# Patient Record
Sex: Male | Born: 1955
Health system: Southern US, Community
[De-identification: ages and names within clinical notes are randomized; demographics above are authoritative.]

## PROBLEM LIST (undated history)

## (undated) DIAGNOSIS — I10 Essential (primary) hypertension: Secondary | ICD-10-CM

## (undated) DIAGNOSIS — W3400XA Accidental discharge from unspecified firearms or gun, initial encounter: Secondary | ICD-10-CM

## (undated) DIAGNOSIS — S065X9A Traumatic subdural hemorrhage with loss of consciousness of unspecified duration, initial encounter: Secondary | ICD-10-CM

## (undated) DIAGNOSIS — F1010 Alcohol abuse, uncomplicated: Secondary | ICD-10-CM

## (undated) DIAGNOSIS — I609 Nontraumatic subarachnoid hemorrhage, unspecified: Secondary | ICD-10-CM

## (undated) DIAGNOSIS — S065XAA Traumatic subdural hemorrhage with loss of consciousness status unknown, initial encounter: Secondary | ICD-10-CM

## (undated) HISTORY — PX: HERNIA REPAIR: SHX51

## (undated) HISTORY — PX: MANDIBLE SURGERY: SHX707

---

## 2001-11-16 ENCOUNTER — Emergency Department (HOSPITAL_COMMUNITY): Admission: EM | Admit: 2001-11-16 | Discharge: 2001-11-16 | Payer: Self-pay | Admitting: Emergency Medicine

## 2006-04-15 ENCOUNTER — Encounter: Payer: Self-pay | Admitting: Surgery

## 2006-04-15 ENCOUNTER — Inpatient Hospital Stay (HOSPITAL_COMMUNITY): Admission: EM | Admit: 2006-04-15 | Discharge: 2006-04-20 | Payer: Self-pay | Admitting: Emergency Medicine

## 2007-03-08 ENCOUNTER — Emergency Department (HOSPITAL_COMMUNITY): Admission: EM | Admit: 2007-03-08 | Discharge: 2007-03-08 | Payer: Self-pay | Admitting: Emergency Medicine

## 2007-03-16 ENCOUNTER — Emergency Department (HOSPITAL_COMMUNITY): Admission: EM | Admit: 2007-03-16 | Discharge: 2007-03-16 | Payer: Self-pay | Admitting: Emergency Medicine
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
## 2011-12-28 ENCOUNTER — Encounter (HOSPITAL_COMMUNITY): Payer: Self-pay | Admitting: Emergency Medicine

## 2011-12-28 ENCOUNTER — Observation Stay (HOSPITAL_COMMUNITY)
Admission: EM | Admit: 2011-12-28 | Discharge: 2011-12-29 | Disposition: A | Discharge status: Home / Self Care | Payer: Self-pay | Attending: Emergency Medicine | Admitting: Emergency Medicine

## 2011-12-28 ENCOUNTER — Emergency Department (HOSPITAL_COMMUNITY): Payer: Self-pay

## 2011-12-28 DIAGNOSIS — I440 Atrioventricular block, first degree: Secondary | ICD-10-CM | POA: Insufficient documentation

## 2011-12-28 DIAGNOSIS — I441 Atrioventricular block, second degree: Secondary | ICD-10-CM | POA: Insufficient documentation

## 2011-12-28 DIAGNOSIS — R0789 Other chest pain: Principal | ICD-10-CM | POA: Insufficient documentation

## 2011-12-28 DIAGNOSIS — R1013 Epigastric pain: Secondary | ICD-10-CM | POA: Insufficient documentation

## 2011-12-28 LAB — COMPREHENSIVE METABOLIC PANEL
Albumin: 3.9 g/dL (ref 3.5–5.2)
Alkaline Phosphatase: 88 U/L (ref 39–117)
BUN: 13 mg/dL (ref 6–23)
CO2: 27 mEq/L (ref 19–32)
Chloride: 99 mEq/L (ref 96–112)
Creatinine, Ser: 0.81 mg/dL (ref 0.50–1.35)
GFR calc non Af Amer: >90 mL/min (ref >90)
Potassium: 3.8 mEq/L (ref 3.5–5.1)
Total Bilirubin: 0.6 mg/dL (ref 0.3–1.2)

## 2011-12-28 LAB — CBC
HCT: 45 % (ref 39.0–52.0)
Hemoglobin: 15.4 g/dL (ref 13.0–17.0)
RBC: 6.33 MIL/uL — ABNORMAL HIGH (ref 4.22–5.81)
WBC: 6.4 10*3/uL (ref 4.0–10.5)

## 2011-12-28 LAB — DIFFERENTIAL
Basophils Relative: 0 % (ref 0–1)
Lymphocytes Relative: 43 % (ref 12–46)
Lymphs Abs: 2.7 10*3/uL (ref 0.7–4.0)
Monocytes Absolute: 0.4 10*3/uL (ref 0.1–1.0)
Monocytes Relative: 7 % (ref 3–12)
Neutro Abs: 3.1 10*3/uL (ref 1.7–7.7)
Neutrophils Relative %: 48 % (ref 43–77)

## 2011-12-28 LAB — LIPASE, BLOOD: Lipase: 23 U/L (ref 11–59)

## 2011-12-28 LAB — POCT I-STAT TROPONIN I
Troponin i, poc: 0 ng/mL (ref 0.00–0.08)
Troponin i, poc: 0 ng/mL (ref 0.00–0.08)

## 2011-12-28 LAB — TROPONIN I: Troponin I: <0.3 ng/mL (ref <0.30)

## 2011-12-28 MED ORDER — MORPHINE SULFATE 4 MG/ML IJ SOLN
4.0000 mg | Freq: Once | INTRAMUSCULAR | Status: AC
  Administered 2011-12-28: 4 mg via INTRAVENOUS
  Filled 2011-12-28: qty 1

## 2011-12-28 MED ORDER — METOPROLOL TARTRATE 25 MG PO TABS
100.0000 mg | ORAL_TABLET | Freq: Once | ORAL | Status: AC
  Administered 2011-12-28: 100 mg via ORAL
  Filled 2011-12-28: qty 4

## 2011-12-28 MED ORDER — METOPROLOL TARTRATE 25 MG PO TABS
100.0000 mg | ORAL_TABLET | Freq: Once | ORAL | Status: DC
  Filled 2011-12-28: qty 4

## 2011-12-28 MED ORDER — GI COCKTAIL ~~LOC~~
30.0000 mL | Freq: Once | ORAL | Status: AC
  Administered 2011-12-28: 30 mL via ORAL
  Filled 2011-12-28: qty 30

## 2011-12-28 MED ORDER — METOPROLOL TARTRATE 1 MG/ML IV SOLN: 5.0000 mg | Freq: Once | INTRAVENOUS | Status: DC

## 2011-12-28 MED ORDER — ONDANSETRON HCL 4 MG/2ML IJ SOLN
4.0000 mg | Freq: Four times a day (QID) | INTRAMUSCULAR | Status: DC | PRN
  Filled 2011-12-28: qty 2

## 2011-12-28 MED ORDER — METOPROLOL TARTRATE 1 MG/ML IV SOLN
5.0000 mg | Freq: Once | INTRAVENOUS | Status: AC
  Administered 2011-12-28: 5 mg via INTRAVENOUS
  Filled 2011-12-28: qty 5

## 2011-12-28 MED ORDER — METOPROLOL TARTRATE 25 MG PO TABS
50.0000 mg | ORAL_TABLET | Freq: Once | ORAL | Status: AC
  Administered 2011-12-28: 50 mg via ORAL
  Filled 2011-12-28: qty 2

## 2011-12-28 NOTE — ED Notes (Signed)
Pt states he had this same pain 2 weeks ago , but it went away has no past hx

## 2011-12-28 NOTE — ED Provider Notes (Signed)
Patient in CDU under chest pain protocol.  Patient was scheduled for coronary CT today, but his heart rate persistently remained above the recommended target rate despite several doses of metoprolol.  Plan was amended to have patient remain in CDU on protocol overnight, and again attempt to complete the study in the AM.  Patient has rested comfortably in bed while in CDU. Troponins negative X 3.  Lungs CTA bilaterally. S1/S2, RRR, no murmur.  Abdomen soft, bowel sounds present.  Strong distal pulses palpated all extremities.   No return of presenting symptoms.  However, patient is now experiencing episodes of heart rate in the mid 30's, but is otherwise asymptomatic (denies CP, dyspnea, diaphoresis, nausea) and is maintaining blood pressure.  Repeat ECG negative for ischemia.  Rhythm strip reveals first degree block with interposed episodes of Mobitz I and Mobitz II rhythm.     Date: 12/28/2011 @ 2010  Rate: 80  Rhythm: sinus bradycardia  QRS Axis: normal  Intervals: PR prolonged and QT prolonged  ST/T Wave abnormalities: nonspecific ST changes  Conduction Disutrbances:second-degree A-V block, ( Mobitz II )  Narrative Interpretation:  First degree block with episodes of Mobitz I and II  Old EKG Reviewed: changes noted   2040:  Spoke with on-call cardiology Tresa Endo) regarding patient.  He feels current rhythm disturbance is likely a side effect of the previously administered metoprolol (patient had received a total of 150 mg po between 1200 and 1415, with a 5 mg dose IV at 1330).  Dr. Tresa Endo recommends having patient continue on protocol, and closely monitor his cardiac status overnight.  As long as rhythm disturbance normalizes, patient can receive a dose of metoprolol in AM for rate control if needed.  May need to consider dopamine if rate stays persistently slow.  12:09 AM Patient remains in a first degree block, still having episodes of second degree block, but frequency has decreased.  Patient  remains asymptomatic.  12:28 AM Patient report provided to Dr. Dierdre Highman.  Plan to withhold beta blocker in AM--decision on CT vs stress echo pending reassessment in AM.  Jimmye Norman, NP 12/29/11 0030

## 2011-12-28 NOTE — ED Notes (Signed)
Pt denies any needs at this time.  Pt in NAD, respirations even and unlabored.

## 2011-12-28 NOTE — ED Notes (Signed)
Turned lights down for pt comfort.  Pt denies any needs at this time.

## 2011-12-28 NOTE — ED Notes (Signed)
Called CT for clarification of test.  Per PA Onalee Hua and radiologist, pt will be held overnight and ?CT/stress in morning.  Meal tray ordered.

## 2011-12-28 NOTE — ED Notes (Signed)
Pt having 2-3/10 CP, pt states it feels like indigestion.  NP Katrinka Blazing aware.  EKG x2 given to NP.

## 2011-12-28 NOTE — ED Notes (Signed)
Pt resting in stretcher.  Pt continues to have HR ranging from low 40's to mid 80's.  Pt denies any needs at this time.  Lights dimmed for pt comfort.

## 2011-12-28 NOTE — ED Notes (Signed)
Patient resting comfortably on stretcher watching TV states chest pain intermittent over a week currently chest pain epigastric 3/10 burning. Airway intact bilateral equal chest rise and fall.

## 2011-12-28 NOTE — ED Provider Notes (Signed)
Pt report received from Lorenz Coaster, [place].  56 year-old male presents with intermittent cp x 1 week.  Pain suggestive of GERD.  No known cardiac hx, however, pt does have risk factors including smoking.  No prior provacative testing.  Pt will be place in CDU under chest pain protocol with coronary CT.  3:13 PM Report given to Felicie Morn, NP who will continue to monitor pt and will dispo as appropriate.    Fayrene Helper, PA-C 12/28/11 1514

## 2011-12-28 NOTE — ED Notes (Signed)
Changes noted on monitor.  NP Katrinka Blazing notified.

## 2011-12-28 NOTE — ED Notes (Signed)
CT called to update on PT HR  And B/P . Pt also reports a CP 4/10

## 2011-12-28 NOTE — ED Notes (Signed)
Meal tray to bedside.

## 2011-12-28 NOTE — ED Notes (Signed)
Tech getting EKG at this time.

## 2011-12-28 NOTE — ED Notes (Signed)
Pt resting.  No needs at this time.  VS remain stable at this time.

## 2011-12-28 NOTE — Progress Notes (Signed)
Observation review is complete. 

## 2011-12-28 NOTE — ED Notes (Signed)
PA spoke with radiologist and the EDP.  Going to give a second dose of metoprolol po instead going to administer IV.

## 2011-12-28 NOTE — ED Provider Notes (Signed)
History     CSN: [id number]  Arrival date & time 12/28/11  0801   First MD Initiated Contact with Patient 12/28/11 0809      Chief Complaint  Patient presents with  . Chest Pain    (Consider location/radiation/quality/duration/timing/severity/associated sxs/prior treatment) The history is provided by the patient.  Pt seen initially at 8:15 AM. 56 y/o generally healthy male who presents to the emergency department with a chief complaint of lower substernal and epigastric pain that began around 645 this morning while he was going about his typical morning activities. Pain described as burning initially and became sharper as it progressed. Nonradiating. Associated with mild nausea, no shortness of breath or diaphoresis. No recent illness include fever or cough. He took 325 mg of aspirin prior to EMS arrival per dispatcher. He was given nitroglycerin by EMS and reports that his pain did improve after that. Currently reports pain is only mild. His risk factors include smoking. He denies any knowledge of hypertension, hyperlipidemia, diabetes, cocaine use. No known cardiac history. Has never seen a cardiologist and has never had a stress test. Denies any known family history of coronary disease. Denies any personal or known family history of pulmonary embolus or DVT.  History reviewed. No pertinent past medical history.  No past surgical history on file.  No family history on file.  History  Substance Use Topics  . Smoking status: Current Some Day Smoker  . Smokeless tobacco: Not on file  . Alcohol Use:       Review of Systems  Constitutional: Negative for fever and diaphoresis.  Respiratory: Negative for cough and shortness of breath.   Cardiovascular: Positive for chest pain. Negative for leg swelling.  Gastrointestinal: Positive for nausea. Negative for vomiting and abdominal pain.  Musculoskeletal: Negative for back pain.  Neurological: Negative for dizziness, syncope and  weakness.  All other systems reviewed and are negative.    Allergies  Review of patient's allergies indicates no known allergies.  Home Medications  No current outpatient prescriptions on file.  BP 144/92  Pulse 97  Temp(Src) 98.2 F (36.8 C) (Oral)  Resp 18  SpO2 98%  Physical Exam  Nursing note and vitals reviewed. Constitutional: He appears well-developed and well-nourished. No distress.  HENT:  Head: Normocephalic and atraumatic.  Right Ear: External ear normal.  Left Ear: External ear normal.       Oral mucosa moist.  Eyes: Pupils are equal, round, and reactive to light.  Neck: Neck supple.  Cardiovascular: Normal rate, regular rhythm and normal heart sounds.  Exam reveals no friction rub.   No murmur heard. Pulses:      Radial pulses are 2+ on the right side, and 2+ on the left side.       Dorsalis pedis pulses are 2+ on the right side, and 1+ on the left side.       Posterior tibial pulses are 2+ on the right side, and 2+ on the left side.  Pulmonary/Chest: Effort normal and breath sounds normal. No respiratory distress. He has no wheezes. He exhibits no tenderness.  Abdominal: Soft. Bowel sounds are normal. He exhibits no distension. There is no tenderness. There is no guarding.  Musculoskeletal: He exhibits no edema and no tenderness.       Calves are supple and nontender  Neurological: He is alert.       Speech is clear. Moves all extremities well. Sensation intact to light touch distally in all 4 extremities.  Skin: Skin is  warm and dry.  Psychiatric: He has a normal mood and affect.    ED Course  Procedures (including critical care time)  Labs Reviewed  CBC - Abnormal; Notable for the following:    RBC 6.33 (*)    MCV 71.1 (*)    MCH 24.3 (*)    RDW 16.1 (*)    All other components within normal limits  COMPREHENSIVE METABOLIC PANEL - Abnormal; Notable for the following:    Glucose, Bld 107 (*)    All other components within normal limits    DIFFERENTIAL  TROPONIN I  LIPASE, BLOOD   Dg Chest 2 View  12/28/2011  *RADIOLOGY REPORT*  Clinical Data: Chest pain.  Smoker  CHEST - 2 VIEW  Comparison: 04/19/2006  Findings: The heart size and mediastinal contours are within normal limits.  Both lungs are clear.  The visualized skeletal structures are unremarkable.  IMPRESSION: No active cardiopulmonary abnormalities.  Original Report Authenticated By: Rosealee Albee, M.D.    Date: 12/28/2011  Rate: 91  Rhythm: sinus  QRS Axis: right  Intervals: normal  ST/T Wave abnormalities: normal  Conduction Disutrbances:none  Narrative Interpretation: No acute findings; borderline r wave progression in anterior leads  Old EKG Reviewed: none available   Dx 1: Chest pain   MDM  Pt with Heart score 2, TIMI 0 with substernal/epigastric pain today assoc with nausea. Improved with NTG. Has had ASA. RF of age, male, smoker, HTN in ED but not previously dx. Will put on chest pain protocol, BMI appears appropriate for Coronary CT- will need metoprolol for HR reduction. Plan discussed with pt, who voices understanding. PT Laveda Norman has been given report on this patient and will follow in the CDU.        Shaaron Adler, PA-C 12/28/11 1021

## 2011-12-28 NOTE — ED Notes (Signed)
Patient verbalized understanding why patient received medication to lower heart rate.  Called CT and will notify when heart rate is lowered.

## 2011-12-28 NOTE — ED Notes (Signed)
Upper epigastric/chest pain lasting ~ 10 mins upon ems arrival had pretty much gone pt given 1 nitro per ems and had taken 325 asa prior to arrival has 18 LAC

## 2011-12-28 NOTE — ED Notes (Signed)
Pt denies any needs at this time.  Pt and wife resting in stretcher in NAD, respirations even and unlabored. NP to bedside.

## 2011-12-29 ENCOUNTER — Other Ambulatory Visit (HOSPITAL_COMMUNITY): Payer: Self-pay

## 2011-12-29 LAB — POCT I-STAT TROPONIN I

## 2011-12-29 MED ORDER — ONDANSETRON HCL 4 MG/2ML IJ SOLN
4.0000 mg | Freq: Once | INTRAMUSCULAR | Status: AC
  Administered 2011-12-29: 4 mg via INTRAVENOUS
  Filled 2011-12-29: qty 2

## 2011-12-29 MED ORDER — OMEPRAZOLE 20 MG PO CPDR: 20.0000 mg | DELAYED_RELEASE_CAPSULE | Freq: Two times a day (BID) | ORAL | Status: DC

## 2011-12-29 MED ORDER — FAMOTIDINE 20 MG PO TABS: 20.0000 mg | ORAL_TABLET | Freq: Two times a day (BID) | ORAL | Status: DC

## 2011-12-29 MED ORDER — MORPHINE SULFATE 4 MG/ML IJ SOLN
4.0000 mg | Freq: Once | INTRAMUSCULAR | Status: AC
  Administered 2011-12-29: 4 mg via INTRAVENOUS
  Filled 2011-12-29 (×2): qty 1

## 2011-12-29 MED ORDER — GI COCKTAIL ~~LOC~~
30.0000 mL | Freq: Once | ORAL | Status: AC
  Administered 2011-12-29: 30 mL via ORAL
  Filled 2011-12-29: qty 30

## 2011-12-29 NOTE — ED Provider Notes (Signed)
Patient having recurrent chest pain, similar to presentation at ED. Left sided, radiates to front from back. No worse with breathing, he reports no worse with movement, but also that it is better if he lies on right side. Positive for nausea. No SOB.   9:30: Third troponin negative. GI Cocktail with improvement in pain. He appears comfortable. CPP not completed secondary to recurrent pain, but patient ruled out for acute coronary syndrome by serial enzymes, in pain not typical for cardiac chest pain, and better with GI Cocktail. Suspect gastric origin and will discharge home with Prilosec and Pepcid with PCP follow up.  Rodena Medin, PA-C 12/29/11 [phone]

## 2011-12-29 NOTE — Discharge Instructions (Signed)
Indigestion  Indigestion is discomfort in the upper belly (abdomen). HOME CARE  Avoid foods and drinks that make your problems worse. You may want to avoid:   Caffeine and alcohol.   Chocolate.   Peppermint.   Garlic and onions.   Spicy foods.   Citrus fruits, such as oranges, lemons, or limes.   Tomato-based foods such as sauce, chili, salsa, and pizza.   Fried and fatty foods.   Avoid eating for 3 hours before your bedtime.   Eat small meals instead of large meals more often.   Stop smoking if you smoke.   Maintain a healthy weight.   Wear loose-fitting clothing. Do not wear anything tight around your waist.   Raise the head of your bed 4 to 8 inches with wood blocks.   Only take medicines as told by your doctor.   Do not take aspirin or ibuprofen.  GET HELP RIGHT AWAY IF:  You are not better after 2 days.   You have chest pain that goes into your neck, arms, back, jaw, or upper belly.   You have trouble swallowing.   You keep throwing up (vomiting).   You have black or bloody poop (stool).   You have a fever.   You have trouble breathing, you feel dizzy, or you pass out (faint).   You are sweating a lot.   You have severe belly pain.   You lose weight without trying.  MAKE SURE YOU:  Understand these instructions.   Will watch your condition.   Will get help right away if you are not doing well or get worse.  Document Released: 09/11/2010 Document Revised: 07/29/2011 Document Reviewed: 03/24/2011 [employer] Patient Information 2012 [employer]. 

## 2011-12-29 NOTE — ED Provider Notes (Signed)
House available during the patient's observation stay.  Notably, while the patient was awaiting provocative testing, he had an episode of second-degree heart block, intermittently both first and second degrees.  We discussed this with the cardiologist.  The patient will remain on protocol, awaiting additional testing.  Gerhard Munch, MD 12/29/11 [phone]

## 2011-12-29 NOTE — ED Notes (Signed)
Pt resting in stretcher in NAD, respirations even and unlabored. 

## 2011-12-29 NOTE — ED Provider Notes (Signed)
Medical screening examination/treatment/procedure(s) were performed by non-physician practitioner and as supervising physician I was immediately available for consultation/collaboration.    Hydie Langan L Adonijah Baena, MD 12/29/11 0734 

## 2011-12-31 NOTE — ED Provider Notes (Signed)
Pain improved with GI cocktail. Negative serial troponins and EKG with presentation that does not suggest ACS. Case d/w Cardiology DR Bensimhon who does not feel PT requires stress testing in the ED and can follow up as outpatient.  Medical screening examination/treatment/procedure(s) were performed by non-physician practitioner and as supervising physician I was immediately available for consultation/collaboration.   Sunnie Nielsen, MD 12/31/11 2258

## 2014-03-11 ENCOUNTER — Encounter (HOSPITAL_COMMUNITY): Payer: Self-pay | Admitting: Emergency Medicine

## 2014-03-11 ENCOUNTER — Emergency Department (HOSPITAL_COMMUNITY)
Admission: EM | Admit: 2014-03-11 | Discharge: 2014-03-11 | Disposition: A | Discharge status: Home / Self Care | Payer: Self-pay | Attending: Emergency Medicine | Admitting: Emergency Medicine

## 2014-03-11 ENCOUNTER — Emergency Department (HOSPITAL_COMMUNITY): Payer: Self-pay

## 2014-03-11 DIAGNOSIS — N509 Disorder of male genital organs, unspecified: Secondary | ICD-10-CM | POA: Insufficient documentation

## 2014-03-11 DIAGNOSIS — F17200 Nicotine dependence, unspecified, uncomplicated: Secondary | ICD-10-CM | POA: Insufficient documentation

## 2014-03-11 DIAGNOSIS — K4090 Unilateral inguinal hernia, without obstruction or gangrene, not specified as recurrent: Secondary | ICD-10-CM | POA: Insufficient documentation

## 2014-03-11 LAB — URINALYSIS, ROUTINE W REFLEX MICROSCOPIC
BILIRUBIN URINE: NEGATIVE
Glucose, UA: NEGATIVE mg/dL
Hgb urine dipstick: NEGATIVE
KETONES UR: 15 mg/dL — AB
Leukocytes, UA: NEGATIVE
NITRITE: NEGATIVE
PH: 5 (ref 5.0–8.0)
Protein, ur: NEGATIVE mg/dL
Specific Gravity, Urine: 1.031 — ABNORMAL HIGH (ref 1.005–1.030)
Urobilinogen, UA: 0.2 mg/dL (ref 0.0–1.0)

## 2014-03-11 LAB — I-STAT CHEM 8, ED
BUN: 12 mg/dL (ref 6–23)
Calcium, Ion: 1.09 mmol/L — ABNORMAL LOW (ref 1.12–1.23)
Chloride: 105 mEq/L (ref 96–112)
Creatinine, Ser: 0.7 mg/dL (ref 0.50–1.35)
GLUCOSE: 92 mg/dL (ref 70–99)
HEMATOCRIT: 51 % (ref 39.0–52.0)
HEMOGLOBIN: 17.3 g/dL — AB (ref 13.0–17.0)
Potassium: 3.8 mEq/L (ref 3.7–5.3)
Sodium: 141 mEq/L (ref 137–147)
TCO2: 21 mmol/L (ref 0–100)

## 2014-03-11 LAB — CBC WITH DIFFERENTIAL/PLATELET
Basophils Absolute: 0 10*3/uL (ref 0.0–0.1)
Basophils Relative: 0 % (ref 0–1)
EOS ABS: 0.2 10*3/uL (ref 0.0–0.7)
Eosinophils Relative: 3 % (ref 0–5)
HEMATOCRIT: 45.7 % (ref 39.0–52.0)
HEMOGLOBIN: 15.2 g/dL (ref 13.0–17.0)
Lymphocytes Relative: 40 % (ref 12–46)
Lymphs Abs: 2.7 10*3/uL (ref 0.7–4.0)
MCH: 24.9 pg — ABNORMAL LOW (ref 26.0–34.0)
MCHC: 33.3 g/dL (ref 30.0–36.0)
MCV: 74.8 fL — ABNORMAL LOW (ref 78.0–100.0)
MONOS PCT: 5 % (ref 3–12)
Monocytes Absolute: 0.3 10*3/uL (ref 0.1–1.0)
NEUTROS PCT: 52 % (ref 43–77)
Neutro Abs: 3.5 10*3/uL (ref 1.7–7.7)
Platelets: 211 10*3/uL (ref 150–400)
RBC: 6.11 MIL/uL — ABNORMAL HIGH (ref 4.22–5.81)
RDW: 15.6 % — ABNORMAL HIGH (ref 11.5–15.5)
WBC: 6.7 10*3/uL (ref 4.0–10.5)

## 2014-03-11 MED ORDER — HERNIA SUPPORT RIGHT MEDIUM MISC: 1.0000 [IU] | Freq: Once | Status: DC

## 2014-03-11 MED ORDER — MORPHINE SULFATE 4 MG/ML IJ SOLN
4.0000 mg | Freq: Once | INTRAMUSCULAR | Status: AC
  Administered 2014-03-11: 4 mg via INTRAVENOUS
  Filled 2014-03-11: qty 1

## 2014-03-11 MED ORDER — IOHEXOL 300 MG/ML  SOLN
25.0000 mL | Freq: Once | INTRAMUSCULAR | Status: AC | PRN
  Administered 2014-03-11: 25 mL via ORAL

## 2014-03-11 MED ORDER — IOHEXOL 300 MG/ML  SOLN
100.0000 mL | Freq: Once | INTRAMUSCULAR | Status: AC | PRN
  Administered 2014-03-11: 100 mL via INTRAVENOUS

## 2014-03-11 MED ORDER — OXYCODONE-ACETAMINOPHEN 5-325 MG PO TABS
1.0000 | ORAL_TABLET | Freq: Four times a day (QID) | ORAL | Status: DC | PRN
Start: 2014-03-11 — End: 2017-01-09

## 2014-03-11 NOTE — ED Notes (Signed)
Pt here by ems for groin pain, onset last Monday, worse with ambulation, some relief with asa. Denies problems with urination or swelling.

## 2014-03-11 NOTE — Discharge Planning (Signed)
[facility] Community Liaison  Spoke to patient about primary care resources and establishing care with a provider. Patient was given the orange card application and instructed to contact me once application was complete. Resource guide and my contact information was provided for future questions or concerns. Patient in pain while this liaison was in the room, Diplomatic Services operational officersecretary notified. No other Community Liaison needs identified at this time.

## 2014-03-11 NOTE — ED Notes (Signed)
Pt advised to slide his pants off so the MD could appropriately assess him.

## 2014-03-11 NOTE — ED Notes (Signed)
Pt returning from being out of the department; pt placed back on monitor, continuous pulse oximetry and blood pressure cuff

## 2014-03-11 NOTE — ED Notes (Signed)
Pt undressed, in gown, on monitor, continuous pulse oximetry and blood pressure cuff 

## 2014-03-11 NOTE — ED Notes (Signed)
CT notified that pt is done with contrast.

## 2014-03-11 NOTE — Discharge Instructions (Signed)

## 2014-03-11 NOTE — ED Notes (Signed)
Pt aware of need of urine specimen; pt at this time attempting to provide a sample

## 2014-03-11 NOTE — ED Provider Notes (Addendum)
CSN: [id number]     Arrival date & time 03/11/14  0729 History   First MD Initiated Contact with Patient 03/11/14 [phone]     Chief Complaint  Patient presents with  . Groin Pain     (Consider location/radiation/quality/duration/timing/severity/associated sxs/prior Treatment) Patient is a 58 y.o. male presenting with groin pain. The history is provided by the patient.  Groin Pain This is a new problem. Pertinent negatives include no chest pain, no abdominal pain, no headaches and no shortness of breath.   patient has had pain in his right groin for the last 6 days. States it got more severe today. States he is having trouble walking due to the pain. Some radiation into the testicle. No dysuria. No nausea. No fevers. No diarrhea or constipation. He has had a previous appendectomy. The pain is dull. It is worse with movement. Sitting makes the pain better  History reviewed. No pertinent past medical history. History reviewed. No pertinent past surgical history. History reviewed. No pertinent family history. History  Substance Use Topics  . Smoking status: Current Some Day Smoker  . Smokeless tobacco: Not on file  . Alcohol Use:     Review of Systems  Constitutional: Negative for activity change and appetite change.  Eyes: Negative for pain.  Respiratory: Negative for chest tightness and shortness of breath.   Cardiovascular: Negative for chest pain and leg swelling.  Gastrointestinal: Negative for nausea, vomiting, abdominal pain and diarrhea.  Genitourinary: Positive for testicular pain. Negative for hematuria, flank pain, discharge, penile swelling, scrotal swelling, genital sores and penile pain.       Right groin pain  Musculoskeletal: Negative for back pain and neck stiffness.  Skin: Negative for rash.  Neurological: Negative for weakness, numbness and headaches.  Psychiatric/Behavioral: Negative for behavioral problems.      Allergies  Review of patient's allergies  indicates no known allergies.  Home Medications   Prior to Admission medications   Medication Sig Start Date End Date Taking? Authorizing Provider  diphenhydramine-acetaminophen (TYLENOL PM) 25-500 MG TABS Take 2 tablets by mouth at bedtime as needed (pain).   Yes Historical Provider, MD  Elastic Bandages & Supports (HERNIA SUPPORT RIGHT MEDIUM) MISC 1 Units by Does not apply route once. 03/11/14   Juliet Rude. Jakia Kennebrew, MD  oxyCODONE-acetaminophen (PERCOCET/ROXICET) 5-325 MG per tablet Take 1-2 tablets by mouth every 6 (six) hours as needed for severe pain. 03/11/14   Juliet Rude. Kendry Pfarr, MD   BP 143/83  Pulse 67  Temp(Src) 97.8 F (36.6 C) (Oral)  Resp 18  Ht 5\' 10"  (1.778 m)  Wt 165 lb (74.844 kg)  BMI 23.68 kg/m2  SpO2 97% Physical Exam  Constitutional: He appears well-developed and well-nourished.  Cardiovascular: Normal rate.   Pulmonary/Chest: Effort normal.  Abdominal: There is tenderness.  Right inguinal tenderness. No clear mass.  Genitourinary: Penis normal.  Tenderness to right testicle. Normal lie. No masses.    ED Course  Procedures (including critical care time) Labs Review Labs Reviewed  URINALYSIS, ROUTINE W REFLEX MICROSCOPIC - Abnormal; Notable for the following:    APPearance HAZY (*)    Specific Gravity, Urine 1.031 (*)    Ketones, ur 15 (*)    All other components within normal limits  CBC WITH DIFFERENTIAL - Abnormal; Notable for the following:    RBC 6.11 (*)    MCV 74.8 (*)    MCH 24.9 (*)    RDW 15.6 (*)    All other components within normal limits  I-STAT  CHEM 8, ED - Abnormal; Notable for the following:    Calcium, Ion 1.09 (*)    Hemoglobin 17.3 (*)    All other components within normal limits    Imaging Review US Scrotum  03/11/2014   CLINICAL DATA:  Right groin pain  EXAM: SCROTAL ULTRASOUND  DOPPLER ULTRASOUND OF THE TESTICLES  TECHNIQUE: Complete ultrasound examination of the testicles, epididymis, and other scrotal structures was  performed. Color and spectral Doppler ultrasound were also utilized to evaluate blood flow to the testicles.  COMPARISON:  None.  FINDINGS: Right testicle  Measurements: 5.1 x 2.7 x 3.9 cm. No mass or microlithiasis visualized.  Left testicle  Measurements: 4.5 x 3.4 x 3.7 cm. No mass or microlithiasis visualized.  Right epididymis:  Normal in size and appearance.  Left epididymis:  Normal in size and appearance.  Hydrocele:  None visualized.  Varicocele:  None visualized.  Pulsed Doppler interrogation of both testes demonstrates low resistance arterial and venous waveforms bilaterally.  IMPRESSION: Normal scrotal ultrasound and Doppler evaluation.   Electronically Signed   By: David  [place]   On: 03/11/2014 09:24   Ct Abdomen Pelvis W Contrast  03/11/2014   CLINICAL DATA:  Right lower quadrant/ inguinal pain.  EXAM: CT ABDOMEN AND PELVIS WITH CONTRAST  TECHNIQUE: Multidetector CT imaging of the abdomen and pelvis was performed using the standard protocol following bolus administration of intravenous contrast.  CONTRAST:  OMNIPAQUE IOHEXOL 300 MG/ML  SOLN  COMPARISON:  Scrotal ultrasound of same date.  CT of 04/15/2006  FINDINGS: Lower chest: Left base scar. Normal heart size without pericardial or pleural effusion.  Liver: Too small to characterize lesion in the inferior right lobe, likely unchanged since the prior. Focal steatosis adjacent the falciform ligament.  Spleen: Normal  Stomach: Underdistended proximally. Apparent wall thickening on image 12 is favored to be secondary.  Pancreas: Normal  Gallbladder/Biliary Tree: Suspect small gallstones without acute cholecystitis or biliary ductal dilatation.  Kidneys/Adrenals: Normal right adrenal gland. Mild left adrenal nodularity. Normal kidneys without hydronephrosis.  Bowel loops: Scattered colonic diverticula. Normal colon and terminal ileum. Appendix is not visualized but there is no evidence of right lower quadrant inflammation.  Vascular: Aortic and  branch vessel atherosclerosis.  Nodes: No retroperitoneal or retrocrural adenopathy. No pelvic adenopathy.  Pelvic Genitourinary: Normal urinary bladder and prostate.  Other: No significant free fluid. Fat containing right inguinal hernia is minimally increased in size. Small.  Bones/Musculoskeletal: Degenerative disc disease at the lumbosacral junction.  IMPRESSION: 1. Small fat containing right inguinal hernia. This is slightly enlarged since the prior exam. 2. No other explanation for right lower quadrant pain. 3. Suspicion of cholelithiasis. 4. Apparent greater curvature gastric wall thickening which is favored to be due to underdistention. Correlate with symptoms to suggest gastritis. 5. Advanced atherosclerosis.   Electronically Signed   By: Jeronimo Greaves M.D.   On: 03/11/2014 13:34   [place] Art/ven Flow Abd Pelv Doppler  03/11/2014   CLINICAL DATA:  Right groin pain  EXAM: SCROTAL ULTRASOUND  DOPPLER ULTRASOUND OF THE TESTICLES  TECHNIQUE: Complete ultrasound examination of the testicles, epididymis, and other scrotal structures was performed. Color and spectral Doppler ultrasound were also utilized to evaluate blood flow to the testicles.  COMPARISON:  None.  FINDINGS: Right testicle  Measurements: 5.1 x 2.7 x 3.9 cm. No mass or microlithiasis visualized.  Left testicle  Measurements: 4.5 x 3.4 x 3.7 cm. No mass or microlithiasis visualized.  Right epididymis:  Normal in size and appearance.  Left epididymis:  Normal in size and appearance.  Hydrocele:  None visualized.  Varicocele:  None visualized.  Pulsed Doppler interrogation of both testes demonstrates low resistance arterial and venous waveforms bilaterally.  IMPRESSION: Normal scrotal ultrasound and Doppler evaluation.   Electronically Signed   By: David  [place]   On: 03/11/2014 09:24     EKG Interpretation None      MDM   Final diagnoses:  Unilateral inguinal hernia without obstruction or gangrene, recurrence not specified   Patient with  groin pain going to his testicle. Negative ultrasound. CT scan shows that hernia. We'll treat with pain medicine and general surgery followup.    Juliet RudeNathan R. Rubin PayorPickering, MD 03/11/14 1545  Juliet RudeNathan R. Rubin PayorPickering, MD 04/03/14 2215

## 2014-03-11 NOTE — ED Notes (Signed)
Pt resting, no needs at this time.

## 2015-09-17 ENCOUNTER — Encounter (HOSPITAL_COMMUNITY): Payer: Self-pay | Admitting: *Deleted

## 2015-09-17 ENCOUNTER — Emergency Department (HOSPITAL_COMMUNITY)
Admission: EM | Admit: 2015-09-17 | Discharge: 2015-09-17 | Disposition: A | Discharge status: Home / Self Care | Payer: Self-pay | Attending: Emergency Medicine | Admitting: Emergency Medicine

## 2015-09-17 DIAGNOSIS — Y93H1 Activity, digging, shoveling and raking: Secondary | ICD-10-CM | POA: Insufficient documentation

## 2015-09-17 DIAGNOSIS — Y990 Civilian activity done for income or pay: Secondary | ICD-10-CM | POA: Insufficient documentation

## 2015-09-17 DIAGNOSIS — Y9289 Other specified places as the place of occurrence of the external cause: Secondary | ICD-10-CM | POA: Insufficient documentation

## 2015-09-17 DIAGNOSIS — Z23 Encounter for immunization: Secondary | ICD-10-CM | POA: Insufficient documentation

## 2015-09-17 DIAGNOSIS — S01511A Laceration without foreign body of lip, initial encounter: Secondary | ICD-10-CM | POA: Insufficient documentation

## 2015-09-17 DIAGNOSIS — W278XXA Contact with other nonpowered hand tool, initial encounter: Secondary | ICD-10-CM | POA: Insufficient documentation

## 2015-09-17 MED ORDER — LIDOCAINE HCL (PF) 1 % IJ SOLN
20.0000 mL | Freq: Once | INTRAMUSCULAR | Status: AC
  Administered 2015-09-17: 20 mL
  Filled 2015-09-17: qty 20

## 2015-09-17 MED ORDER — TRAMADOL HCL 50 MG PO TABS: 50.0000 mg | ORAL_TABLET | Freq: Four times a day (QID) | ORAL | Status: DC | PRN

## 2015-09-17 MED ORDER — CHLORHEXIDINE GLUCONATE 0.12% ORAL RINSE (MEDLINE KIT)
15.0000 mL | Freq: Two times a day (BID) | OROMUCOSAL | Status: DC
Start: 2015-09-17 — End: 2018-08-31

## 2015-09-17 MED ORDER — TETANUS-DIPHTH-ACELL PERTUSSIS 5-2.5-18.5 LF-MCG/0.5 IM SUSP
0.5000 mL | Freq: Once | INTRAMUSCULAR | Status: AC
  Administered 2015-09-17: 0.5 mL via INTRAMUSCULAR
  Filled 2015-09-17: qty 0.5

## 2015-09-17 NOTE — ED Provider Notes (Signed)
CSN: [id number]     Arrival date & time 09/17/15  1604 History  By signing my name below, I, Travis [name], attest that this documentation has been prepared under the direction and in the presence of Travis Captain, PA-C. Electronically Signed: Randell [name], ED Scribe. 09/17/2015. 11:37 AM.   Chief Complaint  [name] presents with  . Lip Laceration   The history is provided by the [name]. No language interpreter was used.   HPI Comments: Travis [name] is a 60 y.o. male who presents to the Emergency Department complaining of 5/10 painful, bottom lip laceration that occurred earlier today shortly PTA while he was at work. [name] reports that he was moving furniture into place when a shovel came loose from the top and fell, striking him in the face and lacerating his lower lip. He denies loose teeth, head trauma, HA, other injuries, or symptoms currently. Tetanus not up-to-date.  History reviewed. No pertinent past medical history. History reviewed. No pertinent past surgical history. No family history on file. Social History  Substance Use Topics  . Smoking status: Never Smoker   . Smokeless tobacco: None  . Alcohol Use: No    Review of Systems  HENT: Negative for dental problem.   Skin: Positive for wound (Laceration or bottom lip).  Neurological: Negative for headaches.      Allergies  Review of [name]'s allergies indicates no known allergies.  Home Medications   Prior to Admission medications   Medication Sig Start Date End Date Taking? Authorizing Provider  chlorhexidine gluconate (PERIDEX) 0.12 % solution Use as directed 15 mLs in the mouth or throat 2 (two) times daily. 09/17/15   Travis Tripathi, PA-C  diphenhydramine-acetaminophen (TYLENOL PM) 25-500 MG TABS Take 2 tablets by mouth at bedtime as needed (pain).    Historical Provider, MD  Elastic Bandages & Supports (HERNIA SUPPORT RIGHT MEDIUM) MISC 1 Units by Does not apply route once. 03/11/14   Benjiman Core, MD  oxyCODONE-acetaminophen (PERCOCET/ROXICET) 5-325 MG per tablet Take 1-2 tablets by mouth every 6 (six) hours as needed for severe pain. 03/11/14   Benjiman Core, MD  traMADol (ULTRAM) 50 MG tablet Take 1 tablet (50 mg total) by mouth every 6 (six) hours as needed. 09/17/15   Travis Ulibarri, PA-C   BP 145/85 mmHg  Pulse 87  Temp(Src) 98.3 F (36.8 C) (Oral)  Resp 18  Ht  (1.778 m)  Wt 72.576 kg  BMI 22.96 kg/m2  SpO2 98% Physical Exam Physical Exam  Constitutional: Pt is oriented to person, place, and time. Appears well-developed and well-nourished. No distress.  HENT:  Head: Normocephalic and atraumatic.  Mouth: No tenderness to the teeth and gums. No obvious injury. Eyes: Conjunctivae are normal. No scleral icterus.  Neck: Normal range of motion.  Cardiovascular: Normal rate, regular rhythm, normal heart sounds and intact distal pulses.   No murmur heard. Capillary refill < 3 sec  Pulmonary/Chest: Effort normal and breath sounds normal. No respiratory distress.  Musculoskeletal: Normal range of motion. Exhibits no edema.  Neurological: Pt is alert and oriented to person, place, and time.  Skin: Skin is warm and dry. Pt. is not diaphoretic.  There is a 4 cm, through-and-through laceration to the bottom lip that involves the vermillion border. Psychiatric: Pt has a normal mood and affect.  Nursing note and vitals reviewed.  ED Course  Procedures  DIAGNOSTIC STUDIES: Oxygen Saturation is 97% on RA, normal by my interpretation.    COORDINATION OF CARE: 5:25 PM Will consult  with attending physician. Will apply ice pack. Will update tetanus. Discussed treatment plan with pt at bedside and pt agreed to plan.  5:36 PM Will perform laceration repair.  LACERATION REPAIR PROCEDURE NOTE The [name]'s identification was confirmed and consent was obtained. This procedure was performed by Travis Captain, PA-C at 6:15 PM. Site: bottom lip, through and through  involves vermillion border Sterile procedures observed Yes Anesthetic used (type and amt): 6 ml of 1% lidocaine Suture type/size: 5-0 vicril Length: 4 cm,  # of Sutures: 8 Technique: simple interrupted Complexity: Complex Tetanus ordered Site anesthetized, irrigated with NS, explored without evidence of foreign body, wound well approximated, site covered with dry, sterile dressing.  [name] tolerated procedure well without complications. Instructions for care discussed verbally and [name] provided with additional written instructions for homecare and f/u.  6:55 PM Will prescribe Peridex and Ultram.   Labs Review Labs Reviewed - No data to display  Imaging Review No results found. I have personally reviewed and evaluated these images and lab results as part of my medical decision-making.   EKG Interpretation None      MDM   Final diagnoses:  Lip laceration, initial encounter    Tetanus updated in ED. Vermilion border well approximated. Laceration occurred < 12 hours prior to repair. Discussed laceration care with pt and answered questions. Pt to f-u for wound check sooner should there be signs of dehiscence or infection. Pt is hemodynamically stable with no complaints prior to dc.      Travis Captain, PA-C 09/21/15 1137  Travis Razor, MD 09/21/15 2132

## 2015-09-17 NOTE — Discharge Instructions (Signed)
Facial Laceration ° A facial laceration is a cut on the face. These injuries can be painful and cause bleeding. Lacerations usually heal quickly, but they need special care to reduce scarring. °DIAGNOSIS  °Your health care provider will take a medical history, ask for details about how the injury occurred, and examine the wound to determine how deep the cut is. °TREATMENT  °Some facial lacerations may not require closure. Others may not be able to be closed because of an increased risk of infection. The risk of infection and the chance for successful closure will depend on various factors, including the amount of time since the injury occurred. °The wound may be cleaned to help prevent infection. If closure is appropriate, pain medicines may be given if needed. Your health care provider will use stitches (sutures), wound glue (adhesive), or skin adhesive strips to repair the laceration. These tools bring the skin edges together to allow for faster healing and a better cosmetic outcome. If needed, you may also be given a tetanus shot. °HOME CARE INSTRUCTIONS °Only take over-the-counter or prescription medicines as directed by your health care provider. °Follow your health care provider's instructions for wound care. These instructions will vary depending on the technique used for closing the wound. °For Sutures: °Keep the wound clean and dry.   °If you were given a bandage (dressing), you should change it at least once a day. Also change the dressing if it becomes wet or dirty, or as directed by your health care provider.   °Wash the wound with soap and water 2 times a day. Rinse the wound off with water to remove all soap. Pat the wound dry with a clean towel.   °After cleaning, apply a thin layer of the antibiotic ointment recommended by your health care provider. This will help prevent infection and keep the dressing from sticking.   °You may shower as usual after the first 24 hours. Do not soak the wound in water  until the sutures are removed.   °Get your sutures removed as directed by your health care provider. With facial lacerations, sutures should usually be taken out after 4-5 days to avoid stitch marks.   °Wait a few days after your sutures are removed before applying any makeup. °For Skin Adhesive Strips: °Keep the wound clean and dry.   °Do not get the skin adhesive strips wet. You may bathe carefully, using caution to keep the wound dry.   °If the wound gets wet, pat it dry with a clean towel.   °Skin adhesive strips will fall off on their own. You may trim the strips as the wound heals. Do not remove skin adhesive strips that are still stuck to the wound. They will fall off in time.   °For Wound Adhesive: °You may briefly wet your wound in the shower or bath. Do not soak or scrub the wound. Do not swim. Avoid periods of heavy sweating until the skin adhesive has fallen off on its own. After showering or bathing, gently pat the wound dry with a clean towel.   °Do not apply liquid medicine, cream medicine, ointment medicine, or makeup to your wound while the skin adhesive is in place. This may loosen the film before your wound is healed.   °If a dressing is placed over the wound, be careful not to apply tape directly over the skin adhesive. This may cause the adhesive to be pulled off before the wound is healed.   °Avoid prolonged exposure to sunlight or tanning lamps while the skin adhesive is in place. °The skin adhesive   will usually remain in place for 5-10 days, then naturally fall off the skin. Do not pick at the adhesive film.   °After Healing: °Once the wound has healed, cover the wound with sunscreen during the day for 1 full year. This can help minimize scarring. Exposure to ultraviolet light in the first year will darken the scar. It can take 1-2 years for the scar to lose its redness and to heal completely.  °SEEK MEDICAL CARE IF: °You have a fever. °SEEK IMMEDIATE MEDICAL CARE IF: °You have redness, pain,  or swelling around the wound.   °You see a yellowish-white fluid (pus) coming from the wound.   °  °This information is not intended to replace advice given to you by your health care provider. Make sure you discuss any questions you have with your health care provider. °  °Document Released: 09/16/2004 Document Revised: 08/30/2014 Document Reviewed: 03/22/2013 °[employer] Interactive Patient Education ©2016 [employer] °Laceration Care, Adult °A laceration is a cut that goes through all of the layers of the skin and into the tissue that is right under the skin. Some lacerations heal on their own. Others need to be closed with stitches (sutures), staples, skin adhesive strips, or skin glue. Proper laceration care minimizes the risk of infection and helps the laceration to heal better. °HOW TO CARE FOR YOUR LACERATION °If sutures or staples were used: °· Keep the wound clean and dry. °· If you were given a bandage (dressing), you should change it at least one time per day or as told by your health care provider. You should also change it if it becomes wet or dirty. °· Keep the wound completely dry for the first 24 hours or as told by your health care provider. After that time, you may shower or bathe. However, make sure that the wound is not soaked in water until after the sutures or staples have been removed. °· Clean the wound one time each day or as told by your health care provider: °¨ Wash the wound with soap and water. °¨ Rinse the wound with water to remove all soap. °¨ Pat the wound dry with a clean towel. Do not rub the wound. °· After cleaning the wound, apply a thin layer of antibiotic ointment as told by your health care provider. This will help to prevent infection and keep the dressing from sticking to the wound. °· Have the sutures or staples removed as told by your health care provider. °If skin adhesive strips were used: °· Keep the wound clean and dry. °· If you were given a bandage (dressing), you  should change it at least one time per day or as told by your health care provider. You should also change it if it becomes dirty or wet. °· Do not get the skin adhesive strips wet. You may shower or bathe, but be careful to keep the wound dry. °· If the wound gets wet, pat it dry with a clean towel. Do not rub the wound. °· Skin adhesive strips fall off on their own. You may trim the strips as the wound heals. Do not remove skin adhesive strips that are still stuck to the wound. They will fall off in time. °If skin glue was used: °· Try to keep the wound dry, but you may briefly wet it in the shower or bath. Do not soak the wound in water, such as by swimming. °· After you have showered or bathed, gently pat the wound dry with a clean towel. Do not   rub the wound. °· Do not do any activities that will make you sweat heavily until the skin glue has fallen off on its own. °· Do not apply liquid, cream, or ointment medicine to the wound while the skin glue is in place. Using those may loosen the film before the wound has healed. °· If you were given a bandage (dressing), you should change it at least one time per day or as told by your health care provider. You should also change it if it becomes dirty or wet. °· If a dressing is placed over the wound, be careful not to apply tape directly over the skin glue. Doing that may cause the glue to be pulled off before the wound has healed. °· Do not pick at the glue. The skin glue usually remains in place for 5-10 days, then it falls off of the skin. °General Instructions °· Take over-the-counter and prescription medicines only as told by your health care provider. °· If you were prescribed an antibiotic medicine or ointment, take or apply it as told by your doctor. Do not stop using it even if your condition improves. °· To help prevent scarring, make sure to cover your wound with sunscreen whenever you are outside after stitches are removed, after adhesive strips are  removed, or when glue remains in place and the wound is healed. Make sure to wear a sunscreen of at least 30 SPF. °· Do not scratch or pick at the wound. °· Keep all follow-up visits as told by your health care provider. This is important. °· Check your wound every day for signs of infection. Watch for: °¨ Redness, swelling, or pain. °¨ Fluid, blood, or pus. °· Raise (elevate) the injured area above the level of your heart while you are sitting or lying down, if possible. °SEEK MEDICAL CARE IF: °· You received a tetanus shot and you have swelling, severe pain, redness, or bleeding at the injection site. °· You have a fever. °· A wound that was closed breaks open. °· You notice a bad smell coming from your wound or your dressing. °· You notice something coming out of the wound, such as wood or glass. °· Your pain is not controlled with medicine. °· You have increased redness, swelling, or pain at the site of your wound. °· You have fluid, blood, or pus coming from your wound. °· You notice a change in the color of your skin near your wound. °· You need to change the dressing frequently due to fluid, blood, or pus draining from the wound. °· You develop a new rash. °· You develop numbness around the wound. °SEEK IMMEDIATE MEDICAL CARE IF: °· You develop severe swelling around the wound. °· Your pain suddenly increases and is severe. °· You develop painful lumps near the wound or on skin that is anywhere on your body. °· You have a red streak going away from your wound. °· The wound is on your hand or foot and you cannot properly move a finger or toe. °· The wound is on your hand or foot and you notice that your fingers or toes look pale or bluish. °  °This information is not intended to replace advice given to you by your health care provider. Make sure you discuss any questions you have with your health care provider. °  °Document Released: 08/09/2005 Document Revised: 12/24/2014 Document Reviewed:  08/05/2014 °[employer] Interactive Patient Education ©2016 [employer] ° °  Revised:  12/24/2014 Document Reviewed: 08/05/2014 [employer] Interactive Patient Education [employer]

## 2015-09-17 NOTE — ED Notes (Signed)
Pt st's he was moving furniture and something hit him in the mouth.  Pt has through and through laceration to bottom lip.  Pt denies any loose teeth

## 2015-09-17 NOTE — ED Notes (Signed)
The pt has a lower lip  Laceration through the [place] border and though and through laceration  No loose teeth

## 2017-01-07 ENCOUNTER — Emergency Department (HOSPITAL_COMMUNITY): Payer: Self-pay

## 2017-01-07 ENCOUNTER — Encounter (HOSPITAL_COMMUNITY): Payer: Self-pay | Admitting: *Deleted

## 2017-01-07 ENCOUNTER — Inpatient Hospital Stay (HOSPITAL_COMMUNITY)
Admission: EM | Admit: 2017-01-07 | Discharge: 2017-01-09 | DRG: 084 | Disposition: A | Discharge status: Home / Self Care | Payer: Self-pay | Attending: Surgery | Admitting: Surgery

## 2017-01-07 DIAGNOSIS — S020XXA Fracture of vault of skull, initial encounter for closed fracture: Secondary | ICD-10-CM

## 2017-01-07 DIAGNOSIS — S066X9A Traumatic subarachnoid hemorrhage with loss of consciousness of unspecified duration, initial encounter: Secondary | ICD-10-CM | POA: Diagnosis present

## 2017-01-07 DIAGNOSIS — R413 Other amnesia: Secondary | ICD-10-CM | POA: Diagnosis present

## 2017-01-07 DIAGNOSIS — S51011A Laceration without foreign body of right elbow, initial encounter: Secondary | ICD-10-CM | POA: Diagnosis present

## 2017-01-07 DIAGNOSIS — Y92410 Unspecified street and highway as the place of occurrence of the external cause: Secondary | ICD-10-CM

## 2017-01-07 DIAGNOSIS — I609 Nontraumatic subarachnoid hemorrhage, unspecified: Secondary | ICD-10-CM

## 2017-01-07 DIAGNOSIS — S0291XA Unspecified fracture of skull, initial encounter for closed fracture: Secondary | ICD-10-CM | POA: Diagnosis present

## 2017-01-07 DIAGNOSIS — S0219XA Other fracture of base of skull, initial encounter for closed fracture: Principal | ICD-10-CM | POA: Diagnosis present

## 2017-01-07 DIAGNOSIS — S0990XA Unspecified injury of head, initial encounter: Secondary | ICD-10-CM

## 2017-01-07 DIAGNOSIS — S065XAA Traumatic subdural hemorrhage with loss of consciousness status unknown, initial encounter: Secondary | ICD-10-CM

## 2017-01-07 DIAGNOSIS — F17210 Nicotine dependence, cigarettes, uncomplicated: Secondary | ICD-10-CM | POA: Diagnosis present

## 2017-01-07 DIAGNOSIS — T148XXA Other injury of unspecified body region, initial encounter: Secondary | ICD-10-CM | POA: Diagnosis present

## 2017-01-07 DIAGNOSIS — S065X9A Traumatic subdural hemorrhage with loss of consciousness of unspecified duration, initial encounter: Secondary | ICD-10-CM | POA: Diagnosis present

## 2017-01-07 DIAGNOSIS — I6200 Nontraumatic subdural hemorrhage, unspecified: Secondary | ICD-10-CM

## 2017-01-07 DIAGNOSIS — G9389 Other specified disorders of brain: Secondary | ICD-10-CM | POA: Diagnosis present

## 2017-01-07 DIAGNOSIS — S06339A Contusion and laceration of cerebrum, unspecified, with loss of consciousness of unspecified duration, initial encounter: Secondary | ICD-10-CM

## 2017-01-07 LAB — COMPREHENSIVE METABOLIC PANEL
ALT: 24 U/L (ref 17–63)
AST: 34 U/L (ref 15–41)
Albumin: 3.4 g/dL — ABNORMAL LOW (ref 3.5–5.0)
Alkaline Phosphatase: 78 U/L (ref 38–126)
Anion gap: 7 (ref 5–15)
BILIRUBIN TOTAL: 0.3 mg/dL (ref 0.3–1.2)
BUN: 12 mg/dL (ref 6–20)
CO2: 26 mmol/L (ref 22–32)
Calcium: 8.7 mg/dL — ABNORMAL LOW (ref 8.9–10.3)
Chloride: 103 mmol/L (ref 101–111)
Creatinine, Ser: 0.76 mg/dL (ref 0.61–1.24)
GFR calc Af Amer: >60 mL/min (ref >60)
GLUCOSE: 94 mg/dL (ref 65–99)
POTASSIUM: 3.5 mmol/L (ref 3.5–5.1)
Sodium: 136 mmol/L (ref 135–145)
TOTAL PROTEIN: 6.5 g/dL (ref 6.5–8.1)

## 2017-01-07 LAB — CBC WITH DIFFERENTIAL/PLATELET
Basophils Absolute: 0 10*3/uL (ref 0.0–0.1)
Basophils Relative: 0 %
EOS PCT: 1 %
Eosinophils Absolute: 0.1 10*3/uL (ref 0.0–0.7)
HCT: 34.5 % — ABNORMAL LOW (ref 39.0–52.0)
Hemoglobin: 11.1 g/dL — ABNORMAL LOW (ref 13.0–17.0)
Lymphocytes Relative: 15 %
Lymphs Abs: 1.5 10*3/uL (ref 0.7–4.0)
MCH: 22.7 pg — ABNORMAL LOW (ref 26.0–34.0)
MCHC: 32.2 g/dL (ref 30.0–36.0)
MCV: 70.7 fL — ABNORMAL LOW (ref 78.0–100.0)
Monocytes Absolute: 0.8 10*3/uL (ref 0.1–1.0)
Monocytes Relative: 8 %
NEUTROS PCT: 76 %
Neutro Abs: 7.4 10*3/uL (ref 1.7–7.7)
Platelets: 237 10*3/uL (ref 150–400)
RBC: 4.88 MIL/uL (ref 4.22–5.81)
RDW: 14.5 % (ref 11.5–15.5)
WBC: 9.8 10*3/uL (ref 4.0–10.5)

## 2017-01-07 LAB — RAPID URINE DRUG SCREEN, HOSP PERFORMED
AMPHETAMINES: NOT DETECTED
Barbiturates: NOT DETECTED
Benzodiazepines: NOT DETECTED
Cocaine: NOT DETECTED
OPIATES: NOT DETECTED
TETRAHYDROCANNABINOL: NOT DETECTED

## 2017-01-07 LAB — PROTIME-INR
INR: 1.04
Prothrombin Time: 13.6 seconds (ref 11.4–15.2)

## 2017-01-07 LAB — I-STAT CHEM 8, ED
BUN: 12 mg/dL (ref 6–20)
CHLORIDE: 100 mmol/L — AB (ref 101–111)
CREATININE: 1 mg/dL (ref 0.61–1.24)
Calcium, Ion: 1.14 mmol/L — ABNORMAL LOW (ref 1.15–1.40)
GLUCOSE: 92 mg/dL (ref 65–99)
HEMATOCRIT: 36 % — AB (ref 39.0–52.0)
Hemoglobin: 12.2 g/dL — ABNORMAL LOW (ref 13.0–17.0)
POTASSIUM: 3.7 mmol/L (ref 3.5–5.1)
Sodium: 138 mmol/L (ref 135–145)
TCO2: 27 mmol/L (ref 0–100)

## 2017-01-07 LAB — TYPE AND SCREEN
ABO/RH(D): B POS
Antibody Screen: NEGATIVE

## 2017-01-07 LAB — ETHANOL: Alcohol, Ethyl (B): 40 mg/dL — ABNORMAL HIGH (ref <5)

## 2017-01-07 LAB — ABO/RH: ABO/RH(D): B POS

## 2017-01-07 MED ORDER — ONDANSETRON HCL 4 MG/2ML IJ SOLN
4.0000 mg | Freq: Four times a day (QID) | INTRAMUSCULAR | Status: DC | PRN
  Filled 2017-01-07: qty 2

## 2017-01-07 MED ORDER — THIAMINE HCL 100 MG/ML IJ SOLN
100.0000 mg | Freq: Once | INTRAMUSCULAR | Status: AC
  Administered 2017-01-07: 100 mg via INTRAVENOUS
  Filled 2017-01-07: qty 2

## 2017-01-07 MED ORDER — IOPAMIDOL (ISOVUE-300) INJECTION 61%
INTRAVENOUS | Status: AC
Start: 2017-01-07 — End: 2017-01-08
  Filled 2017-01-07: qty 100

## 2017-01-07 MED ORDER — SODIUM CHLORIDE 0.9 % IV SOLN
INTRAVENOUS | Status: DC
  Administered 2017-01-07: 22:00:00 via INTRAVENOUS
  Administered 2017-01-08: 100 mL/h via INTRAVENOUS
  Administered 2017-01-08 – 2017-01-09 (×2): via INTRAVENOUS

## 2017-01-07 MED ORDER — LORAZEPAM 2 MG/ML IJ SOLN: 0.0000 mg | Freq: Four times a day (QID) | INTRAMUSCULAR | Status: DC

## 2017-01-07 MED ORDER — ONDANSETRON HCL 4 MG PO TABS
4.0000 mg | ORAL_TABLET | Freq: Four times a day (QID) | ORAL | Status: DC | PRN
Start: 2017-01-07 — End: 2017-01-09

## 2017-01-07 MED ORDER — MORPHINE SULFATE (PF) 4 MG/ML IV SOLN
2.0000 mg | INTRAVENOUS | Status: DC | PRN
  Administered 2017-01-07 – 2017-01-08 (×4): 2 mg via INTRAVENOUS
  Administered 2017-01-09: 4 mg via INTRAVENOUS
  Filled 2017-01-07 (×4): qty 1

## 2017-01-07 MED ORDER — SODIUM CHLORIDE 0.9 % IV BOLUS (SEPSIS)
500.0000 mL | Freq: Once | INTRAVENOUS | Status: AC
Start: 2017-01-07 — End: 2017-01-07
  Administered 2017-01-07: 500 mL via INTRAVENOUS

## 2017-01-07 MED ORDER — LORAZEPAM 2 MG/ML IJ SOLN
0.0000 mg | Freq: Two times a day (BID) | INTRAMUSCULAR | Status: DC
Start: 2017-01-09 — End: 2017-01-09
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
## 2017-01-07 NOTE — ED Notes (Signed)
Pt back from ct. ao x 4.  Speaking on phone with girlfriend.
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
## 2017-01-08 ENCOUNTER — Inpatient Hospital Stay (HOSPITAL_COMMUNITY): Payer: Self-pay

## 2017-01-08 LAB — BASIC METABOLIC PANEL
ANION GAP: 9 (ref 5–15)
BUN: 10 mg/dL (ref 6–20)
CO2: 24 mmol/L (ref 22–32)
Calcium: 8.4 mg/dL — ABNORMAL LOW (ref 8.9–10.3)
Chloride: 102 mmol/L (ref 101–111)
Creatinine, Ser: 0.66 mg/dL (ref 0.61–1.24)
GFR calc Af Amer: >60 mL/min (ref >60)
GLUCOSE: 106 mg/dL — AB (ref 65–99)
POTASSIUM: 3.3 mmol/L — AB (ref 3.5–5.1)
Sodium: 135 mmol/L (ref 135–145)

## 2017-01-08 LAB — MRSA PCR SCREENING: MRSA by PCR: NEGATIVE

## 2017-01-08 LAB — CBC
HCT: 35.3 % — ABNORMAL LOW (ref 39.0–52.0)
Hemoglobin: 11.3 g/dL — ABNORMAL LOW (ref 13.0–17.0)
MCH: 22.6 pg — ABNORMAL LOW (ref 26.0–34.0)
MCHC: 32 g/dL (ref 30.0–36.0)
MCV: 70.5 fL — ABNORMAL LOW (ref 78.0–100.0)
Platelets: 232 10*3/uL (ref 150–400)
RBC: 5.01 MIL/uL (ref 4.22–5.81)
RDW: 14.6 % (ref 11.5–15.5)
WBC: 9.7 10*3/uL (ref 4.0–10.5)

## 2017-01-08 LAB — HIV ANTIBODY (ROUTINE TESTING W REFLEX): HIV SCREEN 4TH GENERATION: NONREACTIVE

## 2017-01-08 MED ORDER — OXYCODONE-ACETAMINOPHEN 5-325 MG PO TABS: 1.0000 | ORAL_TABLET | ORAL | Status: DC | PRN

## 2017-01-08 MED ORDER — PNEUMOCOCCAL VAC POLYVALENT 25 MCG/0.5ML IJ INJ: 0.5000 mL | INJECTION | INTRAMUSCULAR | Status: DC
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
## 2017-01-09 DIAGNOSIS — I6200 Nontraumatic subdural hemorrhage, unspecified: Secondary | ICD-10-CM

## 2017-01-09 DIAGNOSIS — I609 Nontraumatic subarachnoid hemorrhage, unspecified: Secondary | ICD-10-CM

## 2017-01-09 NOTE — Progress Notes (Signed)
D/C papers gone over with pt. IV taken out. No questions/complaints. No prescriptions to give to pt. No questions/complaints. Pt. D/c'd successfully via w/c.

## 2017-01-09 NOTE — Discharge Instructions (Signed)
Keep your elbow clean and dry. Cover with a dry dressing each day after showering. Follow up in our office in 10-14 days for suture removal.   Call our office with any questions or concerns.
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
## 2017-01-11 ENCOUNTER — Telehealth (HOSPITAL_COMMUNITY): Payer: Self-pay

## 2017-01-11 NOTE — Telephone Encounter (Signed)
An appointment has been made for this pt on May 31st at 11:30AM and Toniann FailWendy at [facility] is going to call pt to inform him of his appointment date and time.   Mattie MarlinJessica Mckinnon Glick, [facility] Pager [phone]

## 2017-01-11 NOTE — Telephone Encounter (Signed)
[phone] needs appointment.

## 2018-08-25 ENCOUNTER — Inpatient Hospital Stay (HOSPITAL_COMMUNITY)
Admission: EM | Admit: 2018-08-25 | Discharge: 2018-08-31 | DRG: 292 | Disposition: A | Discharge status: Home / Self Care | Payer: Self-pay | Attending: Internal Medicine | Admitting: Internal Medicine

## 2018-08-25 ENCOUNTER — Emergency Department (HOSPITAL_COMMUNITY): Payer: Self-pay

## 2018-08-25 DIAGNOSIS — I429 Cardiomyopathy, unspecified: Secondary | ICD-10-CM | POA: Diagnosis present

## 2018-08-25 DIAGNOSIS — F1010 Alcohol abuse, uncomplicated: Secondary | ICD-10-CM | POA: Diagnosis present

## 2018-08-25 DIAGNOSIS — I5021 Acute systolic (congestive) heart failure: Secondary | ICD-10-CM | POA: Diagnosis present

## 2018-08-25 DIAGNOSIS — Z8249 Family history of ischemic heart disease and other diseases of the circulatory system: Secondary | ICD-10-CM

## 2018-08-25 DIAGNOSIS — I110 Hypertensive heart disease with heart failure: Principal | ICD-10-CM | POA: Diagnosis present

## 2018-08-25 DIAGNOSIS — R601 Generalized edema: Secondary | ICD-10-CM

## 2018-08-25 DIAGNOSIS — I5023 Acute on chronic systolic (congestive) heart failure: Secondary | ICD-10-CM | POA: Diagnosis present

## 2018-08-25 DIAGNOSIS — I081 Rheumatic disorders of both mitral and tricuspid valves: Secondary | ICD-10-CM | POA: Diagnosis present

## 2018-08-25 DIAGNOSIS — J90 Pleural effusion, not elsewhere classified: Secondary | ICD-10-CM

## 2018-08-25 DIAGNOSIS — R7401 Elevation of levels of liver transaminase levels: Secondary | ICD-10-CM

## 2018-08-25 DIAGNOSIS — E46 Unspecified protein-calorie malnutrition: Secondary | ICD-10-CM | POA: Diagnosis present

## 2018-08-25 DIAGNOSIS — F17210 Nicotine dependence, cigarettes, uncomplicated: Secondary | ICD-10-CM | POA: Diagnosis present

## 2018-08-25 DIAGNOSIS — Z23 Encounter for immunization: Secondary | ICD-10-CM

## 2018-08-25 DIAGNOSIS — D509 Iron deficiency anemia, unspecified: Secondary | ICD-10-CM | POA: Diagnosis present

## 2018-08-25 DIAGNOSIS — R740 Nonspecific elevation of levels of transaminase and lactic acid dehydrogenase [LDH]: Secondary | ICD-10-CM

## 2018-08-25 DIAGNOSIS — I340 Nonrheumatic mitral (valve) insufficiency: Secondary | ICD-10-CM | POA: Diagnosis present

## 2018-08-25 HISTORY — DX: Essential (primary) hypertension: I10

## 2018-08-25 HISTORY — DX: Traumatic subdural hemorrhage with loss of consciousness status unknown, initial encounter: S06.5XAA

## 2018-08-25 HISTORY — DX: Traumatic subdural hemorrhage with loss of consciousness of unspecified duration, initial encounter: S06.5X9A

## 2018-08-25 HISTORY — DX: Nontraumatic subarachnoid hemorrhage, unspecified: I60.9

## 2018-08-25 HISTORY — DX: Alcohol abuse, uncomplicated: F10.10

## 2018-08-25 LAB — CBC
HCT: 40.6 % (ref 39.0–52.0)
HEMOGLOBIN: 12.4 g/dL — AB (ref 13.0–17.0)
MCH: 21.6 pg — ABNORMAL LOW (ref 26.0–34.0)
MCHC: 30.5 g/dL (ref 30.0–36.0)
MCV: 70.6 fL — ABNORMAL LOW (ref 80.0–100.0)
Platelets: 184 10*3/uL (ref 150–400)
RBC: 5.75 MIL/uL (ref 4.22–5.81)
RDW: 18.7 % — ABNORMAL HIGH (ref 11.5–15.5)
WBC: 4.9 10*3/uL (ref 4.0–10.5)
nRBC: 0 % (ref 0.0–0.2)

## 2018-08-25 LAB — TROPONIN I: TROPONIN I: 0.03 ng/mL — AB (ref <0.03)

## 2018-08-25 LAB — COMPREHENSIVE METABOLIC PANEL
ALBUMIN: 2.8 g/dL — AB (ref 3.5–5.0)
ALT: 116 U/L — ABNORMAL HIGH (ref 0–44)
ANION GAP: 8 (ref 5–15)
AST: 106 U/L — ABNORMAL HIGH (ref 15–41)
Alkaline Phosphatase: 212 U/L — ABNORMAL HIGH (ref 38–126)
BILIRUBIN TOTAL: 0.4 mg/dL (ref 0.3–1.2)
BUN: 12 mg/dL (ref 8–23)
CO2: 23 mmol/L (ref 22–32)
Calcium: 8.3 mg/dL — ABNORMAL LOW (ref 8.9–10.3)
Chloride: 108 mmol/L (ref 98–111)
Creatinine, Ser: 1.13 mg/dL (ref 0.61–1.24)
GFR calc Af Amer: >60 mL/min (ref >60)
GLUCOSE: 107 mg/dL — AB (ref 70–99)
Potassium: 3.2 mmol/L — ABNORMAL LOW (ref 3.5–5.1)
Sodium: 139 mmol/L (ref 135–145)
TOTAL PROTEIN: 5.6 g/dL — AB (ref 6.5–8.1)

## 2018-08-25 LAB — TSH: TSH: 3.705 u[IU]/mL (ref 0.350–4.500)

## 2018-08-25 LAB — BRAIN NATRIURETIC PEPTIDE: B Natriuretic Peptide: 1535.6 pg/mL — ABNORMAL HIGH (ref 0.0–100.0)

## 2018-08-25 LAB — I-STAT TROPONIN, ED: TROPONIN I, POC: 0.02 ng/mL (ref 0.00–0.08)

## 2018-08-25 MED ORDER — SENNOSIDES-DOCUSATE SODIUM 8.6-50 MG PO TABS: 1.0000 | ORAL_TABLET | Freq: Every evening | ORAL | Status: DC | PRN

## 2018-08-25 MED ORDER — ACETAMINOPHEN 650 MG RE SUPP: 650.0000 mg | Freq: Four times a day (QID) | RECTAL | Status: DC | PRN

## 2018-08-25 MED ORDER — INFLUENZA VAC SPLIT QUAD 0.5 ML IM SUSY
0.5000 mL | PREFILLED_SYRINGE | INTRAMUSCULAR | Status: AC
  Administered 2018-08-26: 0.5 mL via INTRAMUSCULAR
  Filled 2018-08-25: qty 0.5

## 2018-08-25 MED ORDER — PROMETHAZINE HCL 25 MG PO TABS: 12.5000 mg | ORAL_TABLET | Freq: Four times a day (QID) | ORAL | Status: DC | PRN

## 2018-08-25 MED ORDER — POTASSIUM CHLORIDE CRYS ER 20 MEQ PO TBCR
40.0000 meq | EXTENDED_RELEASE_TABLET | Freq: Once | ORAL | Status: AC
  Administered 2018-08-25: 40 meq via ORAL
  Filled 2018-08-25: qty 2

## 2018-08-25 MED ORDER — ACETAMINOPHEN 325 MG PO TABS: 650.0000 mg | ORAL_TABLET | Freq: Four times a day (QID) | ORAL | Status: DC | PRN

## 2018-08-25 MED ORDER — ENOXAPARIN SODIUM 40 MG/0.4ML ~~LOC~~ SOLN
40.0000 mg | Freq: Every day | SUBCUTANEOUS | Status: DC
  Administered 2018-08-26 – 2018-08-31 (×6): 40 mg via SUBCUTANEOUS
  Filled 2018-08-25 (×6): qty 0.4

## 2018-08-25 MED ORDER — FUROSEMIDE 10 MG/ML IJ SOLN
40.0000 mg | Freq: Once | INTRAMUSCULAR | Status: AC
  Administered 2018-08-25: 40 mg via INTRAVENOUS
  Filled 2018-08-25: qty 4
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
## 2018-08-25 NOTE — ED Triage Notes (Signed)
Pt presents for evaluation of bilateral lower leg swelling and abdominal swelling. Started in ankles with gradual worsening. No cp/sob. No hx of same.

## 2018-08-25 NOTE — ED Notes (Signed)
Paged Floor Coverage Schorr about diastolic BP per floor RN Sheryl. Received call back, no new orders at this time.

## 2018-08-25 NOTE — ED Notes (Signed)
Pedal pulses verified w/ doppler
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
## 2018-08-26 ENCOUNTER — Inpatient Hospital Stay (HOSPITAL_COMMUNITY): Payer: Self-pay

## 2018-08-26 ENCOUNTER — Other Ambulatory Visit: Payer: Self-pay

## 2018-08-26 ENCOUNTER — Encounter (HOSPITAL_COMMUNITY): Payer: Self-pay | Admitting: *Deleted

## 2018-08-26 DIAGNOSIS — R011 Cardiac murmur, unspecified: Secondary | ICD-10-CM

## 2018-08-26 DIAGNOSIS — R7401 Elevation of levels of liver transaminase levels: Secondary | ICD-10-CM

## 2018-08-26 DIAGNOSIS — I340 Nonrheumatic mitral (valve) insufficiency: Secondary | ICD-10-CM

## 2018-08-26 DIAGNOSIS — I361 Nonrheumatic tricuspid (valve) insufficiency: Secondary | ICD-10-CM

## 2018-08-26 DIAGNOSIS — I370 Nonrheumatic pulmonary valve stenosis: Secondary | ICD-10-CM

## 2018-08-26 DIAGNOSIS — F17210 Nicotine dependence, cigarettes, uncomplicated: Secondary | ICD-10-CM

## 2018-08-26 DIAGNOSIS — R7989 Other specified abnormal findings of blood chemistry: Secondary | ICD-10-CM

## 2018-08-26 DIAGNOSIS — R601 Generalized edema: Secondary | ICD-10-CM

## 2018-08-26 DIAGNOSIS — D509 Iron deficiency anemia, unspecified: Secondary | ICD-10-CM

## 2018-08-26 DIAGNOSIS — R740 Nonspecific elevation of levels of transaminase and lactic acid dehydrogenase [LDH]: Secondary | ICD-10-CM

## 2018-08-26 LAB — BASIC METABOLIC PANEL
Anion gap: 10 (ref 5–15)
BUN: 13 mg/dL (ref 8–23)
CHLORIDE: 110 mmol/L (ref 98–111)
CO2: 25 mmol/L (ref 22–32)
Calcium: 8.5 mg/dL — ABNORMAL LOW (ref 8.9–10.3)
Creatinine, Ser: 1.18 mg/dL (ref 0.61–1.24)
GFR calc Af Amer: >60 mL/min (ref >60)
GFR calc non Af Amer: >60 mL/min (ref >60)
Glucose, Bld: 107 mg/dL — ABNORMAL HIGH (ref 70–99)
Potassium: 3.6 mmol/L (ref 3.5–5.1)
Sodium: 145 mmol/L (ref 135–145)

## 2018-08-26 LAB — URINALYSIS, ROUTINE W REFLEX MICROSCOPIC
Bilirubin Urine: NEGATIVE
Glucose, UA: NEGATIVE mg/dL
Hgb urine dipstick: NEGATIVE
Ketones, ur: NEGATIVE mg/dL
Leukocytes, UA: NEGATIVE
Nitrite: NEGATIVE
Protein, ur: NEGATIVE mg/dL
Specific Gravity, Urine: 1.004 — ABNORMAL LOW (ref 1.005–1.030)
pH: 7 (ref 5.0–8.0)

## 2018-08-26 LAB — CBC
HCT: 39.7 % (ref 39.0–52.0)
Hemoglobin: 12.4 g/dL — ABNORMAL LOW (ref 13.0–17.0)
MCH: 21.4 pg — AB (ref 26.0–34.0)
MCHC: 31.2 g/dL (ref 30.0–36.0)
MCV: 68.6 fL — AB (ref 80.0–100.0)
Platelets: 188 10*3/uL (ref 150–400)
RBC: 5.79 MIL/uL (ref 4.22–5.81)
RDW: 18.1 % — ABNORMAL HIGH (ref 11.5–15.5)
WBC: 5.6 10*3/uL (ref 4.0–10.5)
nRBC: 0 % (ref 0.0–0.2)

## 2018-08-26 LAB — HIV ANTIBODY (ROUTINE TESTING W REFLEX): HIV Screen 4th Generation wRfx: NONREACTIVE

## 2018-08-26 LAB — PROTIME-INR
INR: 1.14
Prothrombin Time: 14.5 seconds (ref 11.4–15.2)

## 2018-08-26 LAB — TROPONIN I
Troponin I: <0.03 ng/mL (ref <0.03)
Troponin I: 0.03 ng/mL (ref <0.03)

## 2018-08-26 LAB — LIPID PANEL
Cholesterol: 159 mg/dL (ref 0–200)
HDL: 48 mg/dL (ref >40)
LDL Cholesterol: 96 mg/dL (ref 0–99)
Total CHOL/HDL Ratio: 3.3 RATIO
Triglycerides: 73 mg/dL (ref <150)
VLDL: 15 mg/dL (ref 0–40)

## 2018-08-26 LAB — IRON AND TIBC
Iron: 47 ug/dL (ref 45–182)
Saturation Ratios: 13 % — ABNORMAL LOW (ref 17.9–39.5)
TIBC: 361 ug/dL (ref 250–450)
UIBC: 314 ug/dL

## 2018-08-26 LAB — FERRITIN: Ferritin: 11 ng/mL — ABNORMAL LOW (ref 24–336)

## 2018-08-26 LAB — ECHOCARDIOGRAM COMPLETE
Height: 70 in
Weight: 2763.2 oz

## 2018-08-26 MED ORDER — POTASSIUM CHLORIDE CRYS ER 20 MEQ PO TBCR: 30.0000 meq | EXTENDED_RELEASE_TABLET | Freq: Two times a day (BID) | ORAL | Status: DC

## 2018-08-26 MED ORDER — FUROSEMIDE 10 MG/ML IJ SOLN
40.0000 mg | Freq: Once | INTRAMUSCULAR | Status: AC
  Administered 2018-08-26: 40 mg via INTRAVENOUS
  Filled 2018-08-26: qty 4

## 2018-08-26 MED ORDER — PERFLUTREN LIPID MICROSPHERE
1.0000 mL | INTRAVENOUS | Status: AC | PRN
  Administered 2018-08-26: 2 mL via INTRAVENOUS
  Filled 2018-08-26: qty 10

## 2018-08-26 MED ORDER — FERROUS SULFATE 325 (65 FE) MG PO TABS
325.0000 mg | ORAL_TABLET | Freq: Every day | ORAL | Status: DC
  Administered 2018-08-27 – 2018-08-31 (×5): 325 mg via ORAL
  Filled 2018-08-26 (×6): qty 1

## 2018-08-26 MED ORDER — FUROSEMIDE 10 MG/ML IJ SOLN
40.0000 mg | Freq: Two times a day (BID) | INTRAMUSCULAR | Status: DC
  Administered 2018-08-26: 40 mg via INTRAVENOUS
  Filled 2018-08-26: qty 4

## 2018-08-26 MED ORDER — FOLIC ACID 1 MG PO TABS
1.0000 mg | ORAL_TABLET | Freq: Every day | ORAL | Status: DC
  Administered 2018-08-26 – 2018-08-31 (×6): 1 mg via ORAL
  Filled 2018-08-26 (×6): qty 1

## 2018-08-26 MED ORDER — VITAMIN B-1 100 MG PO TABS
100.0000 mg | ORAL_TABLET | Freq: Every day | ORAL | Status: DC
  Administered 2018-08-26 – 2018-08-31 (×6): 100 mg via ORAL
  Filled 2018-08-26 (×6): qty 1

## 2018-08-26 MED ORDER — PROSIGHT PO TABS
1.0000 | ORAL_TABLET | Freq: Every day | ORAL | Status: DC
  Administered 2018-08-26 – 2018-08-31 (×6): 1 via ORAL
  Filled 2018-08-26 (×6): qty 1

## 2018-08-26 NOTE — Progress Notes (Signed)
  Echocardiogram 2D Echocardiogram has been performed.  Roosvelt Maser F 08/26/2018, 9:22 AM

## 2018-08-26 NOTE — Progress Notes (Signed)
New Admission Note:   Arrival Method: patient arrived from ED via stretcher Mental Orientation: Alert and oriented x 4 Telemetry:  Placed on box 1 Assessment: Completed Skin:  Dry/scaly to both feet with calluses IV: left AC NSL Pain: no complatnts of pain Tubes:none Safety Measures: Safety Fall Prevention Plan has been discussed Admission: Completed [facility] Orientation: Patient has been orientated to the room, unit and staff.  Family: none at bedside  Orders have been reviewed and implemented. Will continue to monitor the patient. Call light has been placed within reach urinal at bedside.   Artemio Aly BSN, RN Phone number: [phone]

## 2018-08-26 NOTE — Progress Notes (Signed)
   Subjective: No acute events overnight.  Travis [name] reports his breathing is better after 2 doses of IV Lasix.  He does continue to feel dyspneic on exertion and has a hard time walking to the bathroom in his room.  Objective:  Vital signs in last 24 hours: Vitals:   08/26/18 0011 08/26/18 0155 08/26/18 0601 08/26/18 0941  BP: (!) 140/96 (!) 126/107 (!) 144/99 138/90  Pulse: (!) 107 (!) 107 (!) 102 (!) 104  Resp: 18  18 18   Temp: (!) 97.5 F (36.4 C) 97.8 F (36.6 C) 98.3 F (36.8 C) 98.4 F (36.9 C)  TempSrc: Oral Oral Oral Oral  SpO2: 99%  99% 98%  Weight: 78.3 kg     Height: 5\' 10"  (1.778 m)      General: Well-appearing male in no acute distress CV: RRR, murmur present at lower sternal border  Pulm: Absent breath sounds at the bilateral bases, no wheezes or crackles remaining fields LE: 3+ LE edema bilaterally up to thighs   Assessment/Plan:  Active Problems:   Anasarca  # Anasarca: Improving with IV Lasix. Differential diagnosis includes: HF, nephrotic syndrome, cirrhosis, and malnutrition. - Echo performed this morning, awaiting results - Lipid panel was normal and UA did not show proteinuria but we will check microalbumin/creatinine ratio - Low suspicion for cirrhosis in the setting of normal platelets and normal INR - Malnutrition could be a component with a low albumin - Continue IV Lasix 40 twice daily  # Transaminitis: Possibly due to congestion in the setting of volume overload.  - Follow up CMP in AM and hepatitis studies - MVI + thiamine + folate given alcohol use  - consider RUS [place] with Doppler to rule out Budd-Chiari  # IDA:  - Started ferrous sulfate 325 gm QD - FOBT   Dispo: Pending workup for anasarca.   Burna Cash, MD 08/26/2018, 9:44 AM Pager: [phone]

## 2018-08-27 ENCOUNTER — Encounter (HOSPITAL_COMMUNITY): Payer: Self-pay | Admitting: Cardiology

## 2018-08-27 DIAGNOSIS — I5021 Acute systolic (congestive) heart failure: Secondary | ICD-10-CM | POA: Diagnosis present

## 2018-08-27 DIAGNOSIS — D509 Iron deficiency anemia, unspecified: Secondary | ICD-10-CM | POA: Diagnosis present

## 2018-08-27 DIAGNOSIS — I340 Nonrheumatic mitral (valve) insufficiency: Secondary | ICD-10-CM | POA: Diagnosis present

## 2018-08-27 LAB — COMPREHENSIVE METABOLIC PANEL
ALBUMIN: 2.5 g/dL — AB (ref 3.5–5.0)
ALT: 70 U/L — ABNORMAL HIGH (ref 0–44)
AST: 40 U/L (ref 15–41)
Alkaline Phosphatase: 152 U/L — ABNORMAL HIGH (ref 38–126)
Anion gap: 6 (ref 5–15)
BUN: 16 mg/dL (ref 8–23)
CO2: 30 mmol/L (ref 22–32)
Calcium: 8.6 mg/dL — ABNORMAL LOW (ref 8.9–10.3)
Chloride: 105 mmol/L (ref 98–111)
Creatinine, Ser: 1.24 mg/dL (ref 0.61–1.24)
GFR calc Af Amer: >60 mL/min (ref >60)
GFR calc non Af Amer: >60 mL/min (ref >60)
GLUCOSE: 104 mg/dL — AB (ref 70–99)
Potassium: 3.9 mmol/L (ref 3.5–5.1)
Sodium: 141 mmol/L (ref 135–145)
Total Bilirubin: 0.6 mg/dL (ref 0.3–1.2)
Total Protein: 5.3 g/dL — ABNORMAL LOW (ref 6.5–8.1)

## 2018-08-27 LAB — CBC
HCT: 38 % — ABNORMAL LOW (ref 39.0–52.0)
Hemoglobin: 12.2 g/dL — ABNORMAL LOW (ref 13.0–17.0)
MCH: 22.4 pg — ABNORMAL LOW (ref 26.0–34.0)
MCHC: 32.1 g/dL (ref 30.0–36.0)
MCV: 69.9 fL — ABNORMAL LOW (ref 80.0–100.0)
Platelets: 182 10*3/uL (ref 150–400)
RBC: 5.44 MIL/uL (ref 4.22–5.81)
RDW: 17.8 % — ABNORMAL HIGH (ref 11.5–15.5)
WBC: 6.7 10*3/uL (ref 4.0–10.5)
nRBC: 0 % (ref 0.0–0.2)

## 2018-08-27 LAB — MAGNESIUM: Magnesium: 2.2 mg/dL (ref 1.7–2.4)

## 2018-08-27 LAB — MICROALBUMIN / CREATININE URINE RATIO
Creatinine, Urine: 18 mg/dL
Microalb Creat Ratio: 174.4 mg/g creat — ABNORMAL HIGH (ref 0.0–30.0)
Microalb, Ur: 31.4 ug/mL — ABNORMAL HIGH

## 2018-08-27 LAB — OCCULT BLOOD X 1 CARD TO LAB, STOOL: Fecal Occult Bld: NEGATIVE

## 2018-08-27 MED ORDER — SPIRONOLACTONE 25 MG PO TABS
25.0000 mg | ORAL_TABLET | Freq: Every day | ORAL | Status: DC
  Administered 2018-08-27 – 2018-08-31 (×5): 25 mg via ORAL
  Filled 2018-08-27 (×5): qty 1

## 2018-08-27 MED ORDER — SACUBITRIL-VALSARTAN 24-26 MG PO TABS
1.0000 | ORAL_TABLET | Freq: Two times a day (BID) | ORAL | Status: DC
  Administered 2018-08-27 – 2018-08-31 (×9): 1 via ORAL
  Filled 2018-08-27 (×9): qty 1

## 2018-08-27 MED ORDER — FUROSEMIDE 10 MG/ML IJ SOLN: 40.0000 mg | Freq: Three times a day (TID) | INTRAMUSCULAR | Status: DC

## 2018-08-27 MED ORDER — FUROSEMIDE 10 MG/ML IJ SOLN
80.0000 mg | Freq: Two times a day (BID) | INTRAMUSCULAR | Status: DC
  Administered 2018-08-27 – 2018-08-30 (×7): 80 mg via INTRAVENOUS
  Filled 2018-08-27 (×7): qty 8

## 2018-08-27 NOTE — Consult Note (Signed)
Cardiology Consultation:   Patient ID: Travis [name] MRN: [id number]; DOB: 07-22-1956  Admit date: 08/25/2018 Date of Consult: 08/27/2018  Primary Care Provider: Patient, No Pcp Per Primary Cardiologist: New; Dr Jens Somrenshaw   Patient Profile:   Travis [name] is a 63 y.o. male with a hx of tobacco abuse who is being seen today for the evaluation of acute systolic congestive heart failure at the request of Blanch MediaElizabeth Butcher MD.  History of Present Illness:   Patient has no prior cardiac history.  Over the past 10 days to 2 weeks he states he has developed increasing bilateral lower extremity edema and weight gain.  Over the past 3 to 4 days he states he has had worsening dyspnea on exertion and orthopnea.  He denies chest pain, palpitations or syncope.  No recent viral infections.  He was admitted and cardiology asked to evaluate.  Past Surgical History:  Procedure Laterality Date  . HERNIA REPAIR Left    inguinal  . MANDIBLE SURGERY       Inpatient Medications: Scheduled Meds: . enoxaparin (LOVENOX) injection  40 mg Subcutaneous Daily  . ferrous sulfate  325 mg Oral Q breakfast  . folic acid  1 mg Oral Daily  . furosemide  80 mg Intravenous BID  . multivitamin  1 tablet Oral Daily  . thiamine  100 mg Oral Daily   Continuous Infusions:  PRN Meds: acetaminophen **OR** acetaminophen, promethazine, senna-docusate  Allergies:   No Known Allergies  Social History:   Social History   Socioeconomic History  . Marital status: Single    Spouse name: Not on file  . Number of children: 0  . Years of education: Not on file  . Highest education level: Not on file  Occupational History    Comment: Maintenance   Social Needs  . Financial resource strain: Not on file  . Food insecurity:    Worry: Not on file    Inability: Not on file  . Transportation needs:    Medical: Not on file    Non-medical: Not on file  Tobacco Use  . Smoking status: Current Every Day Smoker   Packs/day: 0.50  . Smokeless tobacco: Never Used  Substance and Sexual Activity  . Alcohol use: Yes    Comment: 1-3 40's per day  . Drug use: No  . Sexual activity: Not on file  Lifestyle  . Physical activity:    Days per week: Not on file    Minutes per session: Not on file  . Stress: Not on file  Relationships  . Social connections:    Talks on phone: Not on file    Gets together: Not on file    Attends religious service: Not on file    Active member of club or organization: Not on file    Attends meetings of clubs or organizations: Not on file    Relationship status: Not on file  . Intimate partner violence:    Fear of current or ex partner: Not on file    Emotionally abused: Not on file    Physically abused: Not on file    Forced sexual activity: Not on file  Other Topics Concern  . Not on file  Social History Narrative  . Not on file    Family History:    Family History  Problem Relation Age of Onset  . Hypertension Mother      ROS:  Please see the history of present illness.  Patient complains of dyspnea on exertion, orthopnea  and bilateral lower extremity edema.  No fevers, chills, productive cough, melena. All other ROS reviewed and negative.     Physical Exam/Data:   Vitals:   08/26/18 2053 08/27/18 0100 08/27/18 0651 08/27/18 0943  BP: (!) 135/96  (!) 140/103 (!) 137/95  Pulse: (!) 107  100 (!) 103  Resp: 18  16 18   Temp: 98.1 F (36.7 C)  98.2 F (36.8 C) 98 F (36.7 C)  TempSrc: Oral  Oral Oral  SpO2: 98%  97% 97%  Weight:  78.3 kg    Height:        Intake/Output Summary (Last 24 hours) at 08/27/2018 1304 Last data filed at 08/27/2018 1204 Gross per 24 hour  Intake 1200 ml  Output 1675 ml  Net -475 ml   Filed Weights   08/25/18 1743 08/26/18 0011 08/27/18 0100  Weight: 72.6 kg 78.3 kg 78.3 kg   Body mass index is 24.77 kg/m.  General:  Well nourished, well developed, in no acute distress HEENT: normal Lymph: no adenopathy Neck:  positive JVD Endocrine:  No thryomegaly Vascular: No carotid bruits; FA pulses 2+ bilaterally without bruits  Cardiac:  normal S1, S2; RRR; Positive gallop; 2/6 systolic murmur apex Lungs:  clear to auscultation bilaterally, no wheezing, rhonchi or rales  Abd: soft, nontender, no hepatomegaly; positive abdominal wall edema. Ext: 3+ edema Musculoskeletal:  No deformities, BUE and BLE strength normal and equal Skin: warm and dry  Neuro:  CNs 2-12 intact, no focal abnormalities noted Psych:  Normal affect   EKG:  The EKG was personally reviewed and demonstrates: Sinus rhythm, left posterior fascicular block, biatrial enlargement. Telemetry:  Telemetry was personally reviewed and demonstrates:  NSR  Laboratory Data:  Chemistry Recent Labs  Lab 08/25/18 1119 08/26/18 0509 08/27/18 0355  NA 139 145 141  K 3.2* 3.6 3.9  CL 108 110 105  CO2 23 25 30   GLUCOSE 107* 107* 104*  BUN 12 13 16   CREATININE 1.13 1.18 1.24  CALCIUM 8.3* 8.5* 8.6*  GFRNONAA >60 >60 >60  GFRAA >60 >60 >60  ANIONGAP 8 10 6     Recent Labs  Lab 08/25/18 1119 08/27/18 0355  PROT 5.6* 5.3*  ALBUMIN 2.8* 2.5*  AST 106* 40  ALT 116* 70*  ALKPHOS 212* 152*  BILITOT 0.4 0.6   Hematology Recent Labs  Lab 08/25/18 1119 08/26/18 0509 08/27/18 0355  WBC 4.9 5.6 6.7  RBC 5.75 5.79 5.44  HGB 12.4* 12.4* 12.2*  HCT 40.6 39.7 38.0*  MCV 70.6* 68.6* 69.9*  MCH 21.6* 21.4* 22.4*  MCHC 30.5 31.2 32.1  RDW 18.7* 18.1* 17.8*  PLT 184 188 182   Cardiac Enzymes Recent Labs  Lab 08/25/18 2138 08/26/18 0509 08/26/18 0744  TROPONINI 0.03* <0.03 0.03*    Recent Labs  Lab 08/25/18 1127  TROPIPOC 0.02    BNP Recent Labs  Lab 08/25/18 1800  BNP 1,535.6*    Radiology/Studies:  Dg Chest 2 View  Result Date: 08/25/2018 CLINICAL DATA:  Shortness of breath.  Bilateral feet swelling EXAM: CHEST - 2 VIEW COMPARISON:  01/07/2017 FINDINGS: Small to moderate pleural effusions with cardiomegaly and symmetric  indistinct lower lung opacities. There is some cephalized blood flow. IMPRESSION: Cardiomegaly, small to moderate effusions, and lower lobe opacity likely reflecting CHF with atelectasis. The cardiomegaly is new from 2018. Electronically Signed   By: Marnee SpringJonathon  Watts M.D.   On: 08/25/2018 13:04    Assessment and Plan:   1. Acute systolic congestive heart failure-echocardiogram shows severe  LV dysfunction, moderate to severe mitral regurgitation, biatrial enlargement, right ventricular enlargement, severe tricuspid regurgitation.  Etiology of cardiomyopathy unclear but likely possibilities include alcohol (in reviewing chart it appears patient has a history of alcohol abuse in the past) and hypertension.  He is markedly volume overloaded.  Continue Lasix 80 mg IV twice daily.  Add spironolactone 25 mg daily.  Needs fluid restriction to 1 L daily and low-sodium diet.  Follow renal function closely. 2. 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  As outlined above possible etiologies include alcohol versus hypertension.  I will add Entresto 24/26 twice daily.  We will add beta-blockade later as acute congestive heart failure improves.  Titrate medications as tolerated by pulse and blood pressure.  I have asked him to avoid alcohol.  Once medications fully titrated would repeat echocardiogram 3 months later.  If ejection fraction has not improved would then need ischemia evaluation.  Note TSH is normal and HIV nonreactive. 3. Microcytic anemia-we will likely need GI evaluation.  Appears to have iron deficiency. 4. Tobacco abuse-patient counseled on discontinuing. 5. Probable alcohol abuse-patient states he only drinks a sixpack per week but in reviewing records appears to have had problems with alcohol in the past.  I have asked him to abstain. 6. Elevated liver functions-likely from passive congestion versus alcohol use.  Will follow.    For questions or updates, please contact [facility] Please consult  [email or web address] for contact info under     Signed, Olga Millers, MD  08/27/2018 1:04 PM

## 2018-08-27 NOTE — Progress Notes (Signed)
   Subjective: Mr. Dorsa was seen and evaluated at bedside on morning rounds. No acute events overnight. His breathing is better, but notes continued swelling in his lower extremities. Endorses good urine output with Lasix. Discussed that his echo showed heart failure and we will be consulting cardiology team for further work-up.   Objective:  Vital signs in last 24 hours: Vitals:   08/26/18 1656 08/26/18 2053 08/27/18 0100 08/27/18 0651  BP: (!) 140/94 (!) 135/96  (!) 140/103  Pulse: (!) 107 (!) 107  100  Resp: 18 18  16   Temp: 98.8 F (37.1 C) 98.1 F (36.7 C)  98.2 F (36.8 C)  TempSrc: Oral Oral  Oral  SpO2: 97% 98%  97%  Weight:   78.3 kg   Height:       General: awake, alert, sitting up in bed in NAD CV: RRR; SEM at apex  Pulm: normal respiratory effort; decreased breath sounds at bases; no wheezes or crackles  Extremities: 3+ LE edema bilaterally to knees    Assessment/Plan:  Principal Problem:   Acute systolic congestive heart failure (HCC) Active Problems:   Transaminitis   Mitral regurgitation, moderate to severe   IDA (iron deficiency anemia)  # Acute HFrEF exacerbation: Patient presented with anasarca and DOE found to have new diagnosis of HFrEF. His volume status has improved with IV Lasix. Will consult cardiology for ischemic evaluation given new diagnosis.  - Consult cardiology, appreciate recommendations  - Increase IV Lasix 40 mg to TID - Monitor electrolytes while diuresing and replete PRN, goal K>4 and Mag >2 - Will hold off on BB in the setting of acute HFrEF   # Transaminitis: Consistent with congestion in the setting of volume overload; improving with diuresis  - Follow up hepatitis studies - Continue MVI + thiamine + folate given alcohol use   # IDA: Hemoglobin stable at 12.2 - Continue ferrous sulfate 325 gm QD - FOBT negative   Bridget Hartshorn, DO 08/27/2018, 9:21 AM Pager: [phone]

## 2018-08-28 DIAGNOSIS — I5021 Acute systolic (congestive) heart failure: Secondary | ICD-10-CM

## 2018-08-28 LAB — BASIC METABOLIC PANEL
Anion gap: 6 (ref 5–15)
BUN: 13 mg/dL (ref 8–23)
CO2: 31 mmol/L (ref 22–32)
Calcium: 8.8 mg/dL — ABNORMAL LOW (ref 8.9–10.3)
Chloride: 102 mmol/L (ref 98–111)
Creatinine, Ser: 0.97 mg/dL (ref 0.61–1.24)
GFR calc non Af Amer: >60 mL/min (ref >60)
Glucose, Bld: 100 mg/dL — ABNORMAL HIGH (ref 70–99)
Potassium: 3.6 mmol/L (ref 3.5–5.1)
Sodium: 139 mmol/L (ref 135–145)

## 2018-08-28 LAB — MAGNESIUM: Magnesium: 2 mg/dL (ref 1.7–2.4)

## 2018-08-28 LAB — PROTEIN / CREATININE RATIO, URINE
CREATININE, URINE: 12.4 mg/dL
Total Protein, Urine: <6 mg/dL

## 2018-08-28 MED ORDER — POTASSIUM CHLORIDE CRYS ER 20 MEQ PO TBCR
40.0000 meq | EXTENDED_RELEASE_TABLET | Freq: Two times a day (BID) | ORAL | Status: AC
  Administered 2018-08-28 (×2): 40 meq via ORAL
  Filled 2018-08-28 (×2): qty 2

## 2018-08-28 NOTE — Progress Notes (Signed)
Progress Note  Patient Name: Travis [name] Date of Encounter: 08/28/2018  Primary Cardiologist: Olga Millers, MD   Subjective   No CP or dyspnea  Inpatient Medications    Scheduled Meds: . enoxaparin (LOVENOX) injection  40 mg Subcutaneous Daily  . ferrous sulfate  325 mg Oral Q breakfast  . folic acid  1 mg Oral Daily  . furosemide  80 mg Intravenous BID  . multivitamin  1 tablet Oral Daily  . sacubitril-valsartan  1 tablet Oral BID  . spironolactone  25 mg Oral Daily  . thiamine  100 mg Oral Daily   Continuous Infusions:  PRN Meds: acetaminophen **OR** acetaminophen, promethazine, senna-docusate   Vital Signs    Vitals:   08/27/18 1716 08/27/18 2230 08/28/18 0520 08/28/18 0843  BP: (!) 120/92 (!) 136/98 137/84 137/88  Pulse: (!) 52 91 98 93  Resp: 20 18 14 20   Temp: 98.3 F (36.8 C) 98 F (36.7 C) 98.3 F (36.8 C)   TempSrc: Oral Oral Oral   SpO2: 95% 100% 100% 97%  Weight:   69.7 kg   Height:        Intake/Output Summary (Last 24 hours) at 08/28/2018 0959 Last data filed at 08/28/2018 0910 Gross per 24 hour  Intake 1180 ml  Output 1220 ml  Net -40 ml   Filed Weights   08/26/18 0011 08/27/18 0100 08/28/18 0520  Weight: 78.3 kg 78.3 kg 69.7 kg    Telemetry    Sinus with pvcs and NSVT- Personally Reviewed  Physical Exam   GEN: No acute distress.   Neck: positive JVD Cardiac: RRR, positive gallop Respiratory: Clear to auscultation bilaterally. GI: Soft, nontender, non-distended  MS: 2+ edema;  Neuro:  Nonfocal  Psych: Normal affect   Labs    Chemistry Recent Labs  Lab 08/25/18 1119 08/26/18 0509 08/27/18 0355 08/28/18 0742  NA 139 145 141 139  K 3.2* 3.6 3.9 3.6  CL 108 110 105 102  CO2 23 25 30 31   GLUCOSE 107* 107* 104* 100*  BUN 12 13 16 13   CREATININE 1.13 1.18 1.24 0.97  CALCIUM 8.3* 8.5* 8.6* 8.8*  PROT 5.6*  --  5.3*  --   ALBUMIN 2.8*  --  2.5*  --   AST 106*  --  40  --   ALT 116*  --  70*  --   ALKPHOS 212*  --   152*  --   BILITOT 0.4  --  0.6  --   GFRNONAA >60 >60 >60 >60  GFRAA >60 >60 >60 >60  ANIONGAP 8 10 6 6      Hematology Recent Labs  Lab 08/25/18 1119 08/26/18 0509 08/27/18 0355  WBC 4.9 5.6 6.7  RBC 5.75 5.79 5.44  HGB 12.4* 12.4* 12.2*  HCT 40.6 39.7 38.0*  MCV 70.6* 68.6* 69.9*  MCH 21.6* 21.4* 22.4*  MCHC 30.5 31.2 32.1  RDW 18.7* 18.1* 17.8*  PLT 184 188 182    Cardiac Enzymes Recent Labs  Lab 08/25/18 2138 08/26/18 0509 08/26/18 0744  TROPONINI 0.03* <0.03 0.03*    Recent Labs  Lab 08/25/18 1127  TROPIPOC 0.02     BNP Recent Labs  Lab 08/25/18 1800  BNP 1,535.6*      Patient Profile     Travis [name] is a 63 y.o. male with a hx of tobacco abuse who is being seen today for the evaluation of acute systolic congestive heart failure at the request of Blanch Media MD. echocardiogram this admission  showed ejection fraction 20 to 25%, moderate to severe mitral regurgitation, biatrial enlargement, moderate RV dysfunction and severe tricuspid regurgitation.  Assessment & Plan    1. Acute systolic congestive heart failure-echocardiogram shows severe LV dysfunction, moderate to severe mitral regurgitation, biatrial enlargement, right ventricular enlargement, severe tricuspid regurgitation.  Etiology of cardiomyopathy unclear but likely possibilities include alcohol (in reviewing chart it appears patient has a history of alcohol abuse in the past) and hypertension.  Wt 153.7 kg.  He remains volume overloaded.  Continue present dose of diuretic.  Follow renal function closely.  Fluid restrict to 1 L daily.  Low-sodium diet. 2. 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  As outlined previously possible etiologies include alcohol versus hypertension.    Continue Entresto at present dose.  We will add coreg later as acute congestive heart failure improves.  Titrate medications as tolerated by pulse and blood pressure.  I have asked him to avoid alcohol.  Once medications  fully titrated would repeat echocardiogram 3 months later.  If ejection fraction has not improved would then need ischemia evaluation.  Note TSH is normal and HIV nonreactive. 3. Microcytic anemia-Will need GI evaluation; will leave to primary care. 4. Tobacco abuse-patient previously counseled on discontinuing. 5. Probable alcohol abuse-patient states he only drinks a sixpack per week but in reviewing records appears to have had problems with alcohol in the past.  I have asked him to abstain. 6. Elevated liver functions-likely from passive congestion versus alcohol use.  Will follow.  For questions or updates, please contact [facility] Please consult [email or web address] for contact info under        Signed, Olga Millers, MD  08/28/2018, 9:59 AM   '

## 2018-08-28 NOTE — Progress Notes (Signed)
   Subjective: Travis [name] was seen and evaluated at bedside. No acute events overnight. Still endorses some orthopnea, but DOE has improved. Denies chest pain. Endorses good urine output.   Objective:  Vital signs in last 24 hours: Vitals:   08/27/18 0943 08/27/18 1716 08/27/18 2230 08/28/18 0520  BP: (!) 137/95 (!) 120/92 (!) 136/98 137/84  Pulse: (!) 103 (!) 52 91 98  Resp: 18 20 18 14   Temp: 98 F (36.7 C) 98.3 F (36.8 C) 98 F (36.7 C) 98.3 F (36.8 C)  TempSrc: Oral Oral Oral Oral  SpO2: 97% 95% 100% 100%  Weight:    69.7 kg  Height:       General: awake, alert, sitting up in bed in NAD CV: RRR; SEM at apex  Pulm: normal effort; lungs CTA bilaterally Ext: 2+ pitting edema to knees bilaterally    Assessment/Plan:  Principal Problem:   Acute systolic congestive heart failure (HCC) Active Problems:   Transaminitis   Mitral regurgitation, moderate to severe   IDA (iron deficiency anemia)  1. Acute HFrEF exacerbation:  - appreciate cardiology recommendations - possible etiologies include alcohol related cardiomyopathy or related to HTN  - Diuresing well on Lasix 80 BID; weight is down from 160>153.7 lbs - renal function and electrolytes stable - continue Spironolactone 25 mg daily, Entresto BID; will add beta blocker once he is out of acute exacerbation   2. Transaminitis: Consistent with congestion in the setting of volume overload; improving with diuresis  - Follow up hepatitis studies - continue multivitamin, folate, thiamine supplementation with history of alcohol use   3. IDA: Hemoglobin stable at 12.2 - Continue ferrous sulfate 325 gm QD - FOBT negative   - will need outpatient follow-up with GI   Dispo: Anticipated discharge in approximately 2-3 day(s).   Travis [name] D, DO 08/28/2018, 6:54 AM Pager: [phone]

## 2018-08-28 NOTE — Care Management Note (Signed)
Case Management Note Hortencia Conradi, RN MSN CCM Transitions of Care [address] [place] [id number]  Patient Details  Name: Travis [name] MRN: [id number] Date of Birth: July 27, 1956  Subjective/Objective:          Acute systolic congestive heart failure          Action/Plan: 08/28/2018 1341-PTA home alone. Independent. Works. No insurance. Discussed [employer] $4 program and goodrx. No HH needs identified. No DME needs identified. Reviewed HF educational materials using teach back. Patient does have a scale to weigh himself. Patient does have transportation. Discussed HF video and assisted patient with viewing the video. No PCP-scheduled appt for patient f/u with [facility] 1/28 2:50pm-pt informed of the appointment and information placed on AVS. Will continue to follow for transition of care needs.   Expected Discharge Date:                  Expected Discharge Plan:  Home/Self Care  In-House Referral:  NA  Discharge planning Services  CM Consult, Follow-up appt scheduled  Post Acute Care Choice:  NA Choice offered to:  NA  DME Arranged:  N/A DME Agency:  NA  HH Arranged:  NA HH Agency:  NA  Status of Service:  In process, will continue to follow  If discussed at Long Length of Stay Meetings, dates discussed:    Additional Comments:  Travis Kinds, RN 08/28/2018, 1:34 PM

## 2018-08-29 LAB — BASIC METABOLIC PANEL
Anion gap: 9 (ref 5–15)
BUN: 15 mg/dL (ref 8–23)
CO2: 35 mmol/L — AB (ref 22–32)
CREATININE: 1.17 mg/dL (ref 0.61–1.24)
Calcium: 8.8 mg/dL — ABNORMAL LOW (ref 8.9–10.3)
Chloride: 98 mmol/L (ref 98–111)
GFR calc Af Amer: >60 mL/min (ref >60)
GFR calc non Af Amer: >60 mL/min (ref >60)
Glucose, Bld: 102 mg/dL — ABNORMAL HIGH (ref 70–99)
Potassium: 4.2 mmol/L (ref 3.5–5.1)
Sodium: 142 mmol/L (ref 135–145)

## 2018-08-29 LAB — CBC
HCT: 41.6 % (ref 39.0–52.0)
Hemoglobin: 13.1 g/dL (ref 13.0–17.0)
MCH: 21.8 pg — AB (ref 26.0–34.0)
MCHC: 31.5 g/dL (ref 30.0–36.0)
MCV: 69.1 fL — ABNORMAL LOW (ref 80.0–100.0)
Platelets: 207 10*3/uL (ref 150–400)
RBC: 6.02 MIL/uL — ABNORMAL HIGH (ref 4.22–5.81)
RDW: 18.5 % — ABNORMAL HIGH (ref 11.5–15.5)
WBC: 5.8 10*3/uL (ref 4.0–10.5)
nRBC: 0 % (ref 0.0–0.2)

## 2018-08-29 LAB — HEPATITIS C ANTIBODY (REFLEX): HCV AB: 0.4 {s_co_ratio} (ref 0.0–0.9)

## 2018-08-29 LAB — HEPATITIS B CORE ANTIBODY, TOTAL: Hep B Core Total Ab: POSITIVE — AB

## 2018-08-29 LAB — HCV COMMENT:

## 2018-08-29 LAB — HEPATITIS B SURFACE ANTIBODY, QUANTITATIVE: Hep B S AB Quant (Post): 61.2 m[IU]/mL (ref >9.9)

## 2018-08-29 LAB — MAGNESIUM: Magnesium: 2.1 mg/dL (ref 1.7–2.4)

## 2018-08-29 MED ORDER — CARVEDILOL 3.125 MG PO TABS
3.1250 mg | ORAL_TABLET | Freq: Two times a day (BID) | ORAL | Status: DC
  Administered 2018-08-29 – 2018-08-31 (×4): 3.125 mg via ORAL
  Filled 2018-08-29 (×4): qty 1

## 2018-08-29 NOTE — Progress Notes (Signed)
   Subjective: No acute events overnight. Endorses good urine output. Denies chest pain or shortness of breath.   Objective:  Vital signs in last 24 hours: Vitals:   08/28/18 0520 08/28/18 0843 08/28/18 2027 08/29/18 0536  BP: 137/84 137/88 (!) 121/92 (!) 122/96  Pulse: 98 93 100 97  Resp: 14 20 18 18   Temp: 98.3 F (36.8 C) 97.6 F (36.4 C) 97.9 F (36.6 C) 98.5 F (36.9 C)  TempSrc: Oral Oral Oral Oral  SpO2: 100% 97% 96% 99%  Weight: 69.7 kg  69.8 kg   Height:       General: CV:  Pulm:  Ext:   Assessment/Plan:  Principal Problem:   Acute systolic congestive heart failure (HCC) Active Problems:   Transaminitis   Mitral regurgitation, moderate to severe   IDA (iron deficiency anemia)  1. Acute HFrEF exacerbation: - appreciate cardiology recommendations - possible etiologies include alcohol related cardiomyopathy or related to HTN  - responding well to Lasix 80 BID; will continue for another day  - renal function and electrolytes stable - strict I&Os and daily weights  - continue Spironolactone 25 mg daily, Entresto BID - started on Carvedilol 3.125 BID given improvement in volume status   2. Transaminitis:Consistent withcongestion in the setting of volume overload; improving with diuresis - labs did not show acute hepatitis - continue multivitamin, folate, thiamine supplementation with history of alcohol use  - will repeat CMP tomorrow to ensure LFTs have normalized   Dispo: Anticipated discharge in approximately 2-3 day(s).   Lenward Chancellor D, DO 08/29/2018, 2:17 PM Pager: [phone]

## 2018-08-29 NOTE — Progress Notes (Signed)
Progress Note  Patient Name: Travis [name] Date of Encounter: 08/29/2018  Primary Cardiologist: Olga Millers, MD   Subjective   Pt denies CP or dyspnea  Inpatient Medications    Scheduled Meds: . enoxaparin (LOVENOX) injection  40 mg Subcutaneous Daily  . ferrous sulfate  325 mg Oral Q breakfast  . folic acid  1 mg Oral Daily  . furosemide  80 mg Intravenous BID  . multivitamin  1 tablet Oral Daily  . sacubitril-valsartan  1 tablet Oral BID  . spironolactone  25 mg Oral Daily  . thiamine  100 mg Oral Daily   Continuous Infusions:  PRN Meds: acetaminophen **OR** acetaminophen, promethazine, senna-docusate   Vital Signs    Vitals:   08/28/18 0520 08/28/18 0843 08/28/18 2027 08/29/18 0536  BP: 137/84 137/88 (!) 121/92 (!) 122/96  Pulse: 98 93 100 97  Resp: 14 20 18 18   Temp: 98.3 F (36.8 C) 97.6 F (36.4 C) 97.9 F (36.6 C) 98.5 F (36.9 C)  TempSrc: Oral Oral Oral Oral  SpO2: 100% 97% 96% 99%  Weight: 69.7 kg  69.8 kg   Height:        Intake/Output Summary (Last 24 hours) at 08/29/2018 1018 Last data filed at 08/29/2018 0843 Gross per 24 hour  Intake 1220 ml  Output 2075 ml  Net -855 ml   Filed Weights   08/27/18 0100 08/28/18 0520 08/28/18 2027  Weight: 78.3 kg 69.7 kg 69.8 kg    Telemetry    Sinus- Personally Reviewed  Physical Exam   GEN: No acute distress.  WD/WN Neck: supple Cardiac: RRR Respiratory: Clear to auscultation bilaterally; no wheeze. GI: Soft, NT/ND MS: 2+ edema Neuro:  Grossly intact    Labs    Chemistry Recent Labs  Lab 08/25/18 1119  08/27/18 0355 08/28/18 0742 08/29/18 0705  NA 139   < > 141 139 142  K 3.2*   < > 3.9 3.6 4.2  CL 108   < > 105 102 98  CO2 23   < > 30 31 35*  GLUCOSE 107*   < > 104* 100* 102*  BUN 12   < > 16 13 15   CREATININE 1.13   < > 1.24 0.97 1.17  CALCIUM 8.3*   < > 8.6* 8.8* 8.8*  PROT 5.6*  --  5.3*  --   --   ALBUMIN 2.8*  --  2.5*  --   --   AST 106*  --  40  --   --   ALT 116*   --  70*  --   --   ALKPHOS 212*  --  152*  --   --   BILITOT 0.4  --  0.6  --   --   GFRNONAA >60   < > >60 >60 >60  GFRAA >60   < > >60 >60 >60  ANIONGAP 8   < > 6 6 9    < > = values in this interval not displayed.     Hematology Recent Labs  Lab 08/26/18 0509 08/27/18 0355 08/29/18 0705  WBC 5.6 6.7 5.8  RBC 5.79 5.44 6.02*  HGB 12.4* 12.2* 13.1  HCT 39.7 38.0* 41.6  MCV 68.6* 69.9* 69.1*  MCH 21.4* 22.4* 21.8*  MCHC 31.2 32.1 31.5  RDW 18.1* 17.8* 18.5*  PLT 188 182 207    Cardiac Enzymes Recent Labs  Lab 08/25/18 2138 08/26/18 0509 08/26/18 0744  TROPONINI 0.03* <0.03 0.03*    Recent Labs  Lab 08/25/18 1127  TROPIPOC 0.02     BNP Recent Labs  Lab 08/25/18 1800  BNP 1,535.6*      Patient Profile     Travis [name] is a 63 y.o. male with a hx of tobacco abuse who is being seen today for the evaluation of acute systolic congestive heart failure at the request of Blanch Media MD. echocardiogram this admission showed ejection fraction 20 to 25%, moderate to severe mitral regurgitation, biatrial enlargement, moderate RV dysfunction and severe tricuspid regurgitation.  Assessment & Plan    1. Acute systolic congestive heart failure-echocardiogram shows severe LV dysfunction, moderate to severe mitral regurgitation, biatrial enlargement, right ventricular enlargement, severe tricuspid regurgitation.  Etiology of cardiomyopathy unclear but likely possibilities include alcohol (in reviewing chart it appears patient has a history of alcohol abuse in the past) and hypertension. He remains volume overloaded today but much improved. Continue present dose of Lasix and spironolactone.  Follow renal function. As outlined in previous notes fluid restrict to 1 L daily and needs low-sodium diet.  2. 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  As outlined previously possible etiologies include alcohol versus hypertension.    Continue Entresto at present dose.  Begin carvedilol 3.125  mg twice daily today.   Will titrate medications as tolerated by pulse and blood pressure.  I have asked him to avoid alcohol.  Once medications fully titrated would repeat echocardiogram 3 months later.  If ejection fraction has not improved would then need ischemia evaluation.  Note TSH is normal and HIV nonreactive. 3. Microcytic anemia-Will need GI evaluation; will leave to primary care. 4. Tobacco abuse-counseled previously on discontinuing. 5. Probable alcohol abuse-counseled previously to avoid. 6. Elevated liver functions-felt to be secondary to passive congestion.  Will need repeat liver functions in the future.  For questions or updates, please contact [facility] Please consult [email or web address] for contact info under        Signed, Olga Millers, MD  08/29/2018, 10:18 AM   '

## 2018-08-30 LAB — COMPREHENSIVE METABOLIC PANEL
ALBUMIN: 3.1 g/dL — AB (ref 3.5–5.0)
ALT: 50 U/L — ABNORMAL HIGH (ref 0–44)
AST: 36 U/L (ref 15–41)
Alkaline Phosphatase: 152 U/L — ABNORMAL HIGH (ref 38–126)
Anion gap: 7 (ref 5–15)
BUN: 16 mg/dL (ref 8–23)
CHLORIDE: 95 mmol/L — AB (ref 98–111)
CO2: 37 mmol/L — ABNORMAL HIGH (ref 22–32)
Calcium: 8.9 mg/dL (ref 8.9–10.3)
Creatinine, Ser: 1.09 mg/dL (ref 0.61–1.24)
GFR calc Af Amer: >60 mL/min (ref >60)
GFR calc non Af Amer: >60 mL/min (ref >60)
Glucose, Bld: 96 mg/dL (ref 70–99)
Potassium: 4.7 mmol/L (ref 3.5–5.1)
Sodium: 139 mmol/L (ref 135–145)
Total Bilirubin: 0.7 mg/dL (ref 0.3–1.2)
Total Protein: 6.1 g/dL — ABNORMAL LOW (ref 6.5–8.1)

## 2018-08-30 LAB — MAGNESIUM: Magnesium: 2.2 mg/dL (ref 1.7–2.4)

## 2018-08-30 NOTE — Progress Notes (Signed)
Progress Note  Patient Name: Travis [name] Date of Encounter: 08/30/2018  Primary Cardiologist: Olga Millers, MD   Subjective   No CP or dyspnea  Inpatient Medications    Scheduled Meds: . carvedilol  3.125 mg Oral BID WC  . enoxaparin (LOVENOX) injection  40 mg Subcutaneous Daily  . ferrous sulfate  325 mg Oral Q breakfast  . folic acid  1 mg Oral Daily  . furosemide  80 mg Intravenous BID  . multivitamin  1 tablet Oral Daily  . sacubitril-valsartan  1 tablet Oral BID  . spironolactone  25 mg Oral Daily  . thiamine  100 mg Oral Daily   Continuous Infusions:  PRN Meds: acetaminophen **OR** acetaminophen, promethazine, senna-docusate   Vital Signs    Vitals:   08/29/18 1000 08/29/18 1650 08/29/18 2022 08/30/18 0403  BP: 122/88 121/82 116/82 (!) 131/94  Pulse: 86 78 90 99  Resp: 18 18 18 20   Temp: 98.2 F (36.8 C) 98.4 F (36.9 C) 98.3 F (36.8 C) 98.5 F (36.9 C)  TempSrc: Oral Oral Oral Oral  SpO2: 98% 98% 97% 100%  Weight:   72.3 kg   Height:        Intake/Output Summary (Last 24 hours) at 08/30/2018 0906 Last data filed at 08/29/2018 2200 Gross per 24 hour  Intake 720 ml  Output 1872 ml  Net -1152 ml   Filed Weights   08/28/18 0520 08/28/18 2027 08/29/18 2022  Weight: 69.7 kg 69.8 kg 72.3 kg    Telemetry    Sinus with rare PAC and PVC- Personally Reviewed  Physical Exam   GEN: WD/WN, NAD Neck: supple; no JVD Cardiac: RRR, no murmur Respiratory: CTA GI: Soft, NT/ND, no masses MS: 1+ edema Neuro:  No focal findings    Labs    Chemistry Recent Labs  Lab 08/25/18 1119  08/27/18 0355 08/28/18 0742 08/29/18 0705 08/30/18 0624  NA 139   < > 141 139 142 139  K 3.2*   < > 3.9 3.6 4.2 4.7  CL 108   < > 105 102 98 95*  CO2 23   < > 30 31 35* 37*  GLUCOSE 107*   < > 104* 100* 102* 96  BUN 12   < > 16 13 15 16   CREATININE 1.13   < > 1.24 0.97 1.17 1.09  CALCIUM 8.3*   < > 8.6* 8.8* 8.8* 8.9  PROT 5.6*  --  5.3*  --   --  6.1*  ALBUMIN  2.8*  --  2.5*  --   --  3.1*  AST 106*  --  40  --   --  36  ALT 116*  --  70*  --   --  50*  ALKPHOS 212*  --  152*  --   --  152*  BILITOT 0.4  --  0.6  --   --  0.7  GFRNONAA >60   < > >60 >60 >60 >60  GFRAA >60   < > >60 >60 >60 >60  ANIONGAP 8   < > 6 6 9 7    < > = values in this interval not displayed.     Hematology Recent Labs  Lab 08/26/18 0509 08/27/18 0355 08/29/18 0705  WBC 5.6 6.7 5.8  RBC 5.79 5.44 6.02*  HGB 12.4* 12.2* 13.1  HCT 39.7 38.0* 41.6  MCV 68.6* 69.9* 69.1*  MCH 21.4* 22.4* 21.8*  MCHC 31.2 32.1 31.5  RDW 18.1* 17.8* 18.5*  PLT  188 182 207    Cardiac Enzymes Recent Labs  Lab 08/25/18 2138 08/26/18 0509 08/26/18 0744  TROPONINI 0.03* <0.03 0.03*    Recent Labs  Lab 08/25/18 1127  TROPIPOC 0.02     BNP Recent Labs  Lab 08/25/18 1800  BNP 1,535.6*      Patient Profile     Travis [name] is a 63 y.o. male with a hx of tobacco abuse who is being seen today for the evaluation of acute systolic congestive heart failure at the request of Blanch MediaElizabeth Butcher MD. echocardiogram this admission showed ejection fraction 20 to 25%, moderate to severe mitral regurgitation, biatrial enlargement, moderate RV dysfunction and severe tricuspid regurgitation.  Assessment & Plan    1. Acute systolic congestive heart failure-echocardiogram shows severe LV dysfunction, moderate to severe mitral regurgitation, biatrial enlargement, right ventricular enlargement, severe tricuspid regurgitation.  Etiology of cardiomyopathy unclear but likely possibilities include alcohol (in reviewing chart it appears patient has a history of alcohol abuse in the past) and hypertension.  Volume status continues to improve.  We will continue present dose of diuretics today.  Can likely change to oral form tomorrow.  Follow potassium and renal function.  Continue fluid restriction and low-sodium diet. 2. 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  As outlined previously possible etiologies  include alcohol versus hypertension.    Continue Entresto and carvedilol at present dose. Will titrate medications as tolerated by pulse and blood pressure following DC.  I have asked him to avoid alcohol.  Once medications fully titrated would repeat echocardiogram 3 months later.  If ejection fraction has not improved would then need ischemia evaluation.  Note TSH is normal and HIV nonreactive. 3. Microcytic anemia-Will need GI evaluation; will leave to primary care. 4. Tobacco abuse-counseled previously on discontinuing. 5. Probable alcohol abuse-counseled previously to avoid. 6. Elevated liver functions-felt to be secondary to passive congestion.  Will need repeat liver functions in the future.  For questions or updates, please contact [facility] Please consult [email or web address] for contact info under        Signed, Olga MillersBrian Zamara Cozad, MD  08/30/2018, 9:06 AM   '

## 2018-08-30 NOTE — Progress Notes (Signed)
I received a call from Inpatient Care Manager regarding patient and his HF diagnosis.  Patient is currently not insured and she was concerned about his ability to afford new HF medications.  I have scheduled patient a follow-up appt in the [facility].  I will also make referral to [facility] outpatient LCSW  to assist with on-going medication assistance needs as well as financial concerns.  I can facilitate sending patient home with HF medications through the HF Fund and [facility] once those are determined at the time of discharge.

## 2018-08-30 NOTE — Discharge Instructions (Signed)
Healthcare.gov  An official [place] government website that is the home of the [employer]. The website provides information on how individuals may qualify for coverage through Marketplace plans, Medicaid, or the [facility] and valuable information about the Marketplace, including appeals processes and how to get the most out of insurance coverage and Medicaid. 303-118-5130 Http://www.healthcare.gov  [employer].  A patient assistance foundation that provides free [employer] medicines to qualified individuals who experience financial hardship and who have no prescription drug coverage.  1-([phone] http://www.pap.novartis.com

## 2018-08-30 NOTE — Progress Notes (Signed)
   Subjective: Travis [name] was seen and evaluated at bedside. No acute events overnight. He denies orthopnea, DOE, or chest pain. His leg swelling has improved significantly. Endorses good urine output.   Objective:  Vital signs in last 24 hours: Vitals:   08/29/18 1650 08/29/18 2022 08/30/18 0403 08/30/18 0956  BP: 121/82 116/82 (!) 131/94 123/78  Pulse: 78 90 99 96  Resp: 18 18 20 18   Temp: 98.4 F (36.9 C) 98.3 F (36.8 C) 98.5 F (36.9 C)   TempSrc: Oral Oral Oral   SpO2: 98% 97% 100% 99%  Weight:  72.3 kg    Height:       General: awake, alert, sitting up in bed in NAD CV: RRR Pulm: normal respiratory effort; lungs CTA bilaterally Ext: 1+ pitting edema bilaterally to mid-shin  Assessment/Plan:  Principal Problem:   Acute systolic congestive heart failure (HCC) Active Problems:   Transaminitis   Mitral regurgitation, moderate to severe   IDA (iron deficiency anemia)  1.Acute HFrEF exacerbation: - likely related to longstanding EtOH use or HTN - appreciate cardiology following -continues to diurese well; plan for IV lasix one more day and will transition to oral tomorrow  - renal function and electrolytes stable - strict I&Os and daily weights  - he is on Spironolactone 25 mg, Carvediolol 3.125 BID and Entresto BID; will touch base with cardiology regarding cost issues with [name] since patient is uninsured for alternative therapy at discharge    2.Transaminitis:Consistent withcongestion in the setting of volume overload  - AST has normalized, ALT only mildly elevated  - checking hep B surface antigen to ensure he does not have active infection, as 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 IDA  - patient is currently on iron supplementation - will need outpatient work-up for etiology   Dispo: Anticipated discharge  in approximately 2 day(s).   Lenward Chancellor D, DO 08/30/2018, 1:46 PM Pager: [phone]

## 2018-08-30 NOTE — Care Management Note (Signed)
Case Management Note Hortencia Conradi, RN MSN CCM Transitions of Care [address] [place] [phone]  Patient Details  Name: Travis [name] MRN: [id number] Date of Birth: 1956/07/11  Subjective/Objective:          Acute systolic congestive heart failure          Action/Plan: 08/28/2018 1341-PTA home alone. Independent. Works. No insurance. Discussed [employer] $4 program and goodrx. No HH needs identified. No DME needs identified. Reviewed HF educational materials using teach back. Patient does have a scale to weigh himself. Patient does have transportation. Discussed HF video and assisted patient with viewing the video. No PCP-scheduled appt for [facility] 1/28 2:50pm-pt informed of the appointment and information placed on AVS. Will continue to follow for transition of care needs.   Expected Discharge Date:                  Expected Discharge Plan:  Home/Self Care  In-House Referral:  NA  Discharge planning Services  CM Consult, Follow-up appt scheduled, HF Clinic  Post Acute Care Choice:  NA Choice offered to:  NA  DME Arranged:  N/A DME Agency:  NA  HH Arranged:  NA HH Agency:  NA  Status of Service:  In process, will continue to follow  If discussed at Long Length of Stay Meetings, dates discussed:    Additional Comments: 08/30/2018-1706-Jaliel Deavers Nicoletta Ba, RN MSN CCM- Received message from HF clinic liason that pt was scheduled for outpatient f/u for medication assistance. Appt noted 1/20. Will need list of HF medications needed for outpatient. Medications will be filled by HF outpatient pharmacy.   08/30/2018-1500-Mahrukh Seguin Nicoletta Ba, RN MSN CCM Spoke with Bard Herbert at [facility] about possible referral or assistance with [facility]. Pt does not have insurance. If unable to follow up with HF clinic, will provide 30 day card and resource information. Will continue to follow for transition of care needs.   Bess Kinds, RN 08/30/2018, 5:05 PM

## 2018-08-30 NOTE — Care Management Note (Signed)
Case Management Note Hortencia Conradi, RN MSN CCM Transitions of Care [address] [place] [phone]  Patient Details  Name: Devvon Arnwine MRN: [id number] Date of Birth: 03-06-1956  Subjective/Objective:          Acute systolic congestive heart failure          Action/Plan: 08/28/2018 1341-PTA home alone. Independent. Works. No insurance. Discussed [employer] $4 program and goodrx. No HH needs identified. No DME needs identified. Reviewed HF educational materials using teach back. Patient does have a scale to weigh himself. Patient does have transportation. Discussed HF video and assisted patient with viewing the video. No PCP-scheduled appt for patient f/[facility] 1/28 2:50pm-pt informed of the appointment and information placed on AVS. Will continue to follow for transition of care needs.   Expected Discharge Date:                  Expected Discharge Plan:  Home/Self Care  In-House Referral:  NA  Discharge planning Services  CM Consult, Follow-up appt scheduled, HF Clinic  Post Acute Care Choice:  NA Choice offered to:  NA  DME Arranged:  N/A DME Agency:  NA  HH Arranged:  NA HH Agency:  NA  Status of Service:  In process, will continue to follow  If discussed at Long Length of Stay Meetings, dates discussed:    Additional Comments: 08/30/2018-1500-Phoua Hoadley Nicoletta Ba, RN MSN CCM Spoke with Daphne at [facility] about possible referral or assistance with [facility]. Pt does not have insurance. If unable to follow up with HF clinic, will provide 30 day card and resource information. Will continue to follow for transition of care needs.   Bess Kinds, RN 08/30/2018, 2:59 PM

## 2018-08-31 ENCOUNTER — Other Ambulatory Visit (HOSPITAL_COMMUNITY): Payer: Self-pay

## 2018-08-31 ENCOUNTER — Telehealth (HOSPITAL_COMMUNITY): Payer: Self-pay | Admitting: Surgery

## 2018-08-31 DIAGNOSIS — Z79899 Other long term (current) drug therapy: Secondary | ICD-10-CM

## 2018-08-31 DIAGNOSIS — I081 Rheumatic disorders of both mitral and tricuspid valves: Secondary | ICD-10-CM

## 2018-08-31 LAB — BASIC METABOLIC PANEL
Anion gap: 6 (ref 5–15)
BUN: 15 mg/dL (ref 8–23)
CALCIUM: 8.4 mg/dL — AB (ref 8.9–10.3)
CO2: 33 mmol/L — ABNORMAL HIGH (ref 22–32)
Chloride: 98 mmol/L (ref 98–111)
Creatinine, Ser: 1.17 mg/dL (ref 0.61–1.24)
GFR calc Af Amer: >60 mL/min (ref >60)
GFR calc non Af Amer: >60 mL/min (ref >60)
GLUCOSE: 91 mg/dL (ref 70–99)
Potassium: 3.9 mmol/L (ref 3.5–5.1)
Sodium: 137 mmol/L (ref 135–145)

## 2018-08-31 LAB — HEPATITIS A ANTIBODY, TOTAL: Hep A Total Ab: NEGATIVE

## 2018-08-31 LAB — HEPATITIS A ANTIBODY, IGM: Hep A IgM: NEGATIVE

## 2018-08-31 LAB — HEPATITIS B SURFACE ANTIGEN: Hepatitis B Surface Ag: NEGATIVE

## 2018-08-31 LAB — MAGNESIUM: Magnesium: 2 mg/dL (ref 1.7–2.4)

## 2018-08-31 MED ORDER — PROSIGHT PO TABS: 1.0000 | ORAL_TABLET | Freq: Every day | ORAL | 0 refills | Status: DC

## 2018-08-31 MED ORDER — FERROUS SULFATE 325 (65 FE) MG PO TABS: 325.0000 mg | ORAL_TABLET | Freq: Every day | ORAL | 3 refills | Status: DC

## 2018-08-31 MED ORDER — SACUBITRIL-VALSARTAN 24-26 MG PO TABS: 1.0000 | ORAL_TABLET | Freq: Two times a day (BID) | ORAL | 0 refills | Status: DC

## 2018-08-31 MED ORDER — SPIRONOLACTONE 25 MG PO TABS: 25.0000 mg | ORAL_TABLET | Freq: Every day | ORAL | 0 refills | Status: DC

## 2018-08-31 MED ORDER — FOLIC ACID 1 MG PO TABS: 1.0000 mg | ORAL_TABLET | Freq: Every day | ORAL | 3 refills | Status: DC

## 2018-08-31 MED ORDER — FUROSEMIDE 40 MG PO TABS
40.0000 mg | ORAL_TABLET | Freq: Every day | ORAL | Status: DC
  Administered 2018-08-31: 40 mg via ORAL
  Filled 2018-08-31: qty 1

## 2018-08-31 MED ORDER — FUROSEMIDE 40 MG PO TABS: 40.0000 mg | ORAL_TABLET | Freq: Every day | ORAL | 0 refills | Status: DC

## 2018-08-31 MED ORDER — CARVEDILOL 3.125 MG PO TABS: 3.1250 mg | ORAL_TABLET | Freq: Two times a day (BID) | ORAL | Status: DC

## 2018-08-31 MED ORDER — THIAMINE HCL 100 MG PO TABS: 100.0000 mg | ORAL_TABLET | Freq: Every day | ORAL | 3 refills | Status: DC

## 2018-08-31 MED FILL — CARVEDILOL 3.125 MG TABLET: 3.125 | 34 days supply | Qty: 68 | Fill #0

## 2018-08-31 MED FILL — FUROSEMIDE 40 MG TAB: 40 | 34 days supply | Qty: 100 | Fill #0

## 2018-08-31 MED FILL — SPIRONOLACTONE 25 MG TABLET: 25 | 34 days supply | Qty: 34 | Fill #0

## 2018-08-31 NOTE — Progress Notes (Signed)
I will send the following HF Medications to [facility] through the HF Fund.  Furosemide 40 mg Daily Spironolactone 25 mg daily Carvedilol 3.125 Twice Daily Entresto 24-26 BID to be filled with 30 day free card. I have also sent referral to the HF [employer] program as well as to [facility] LCSW.to assist with ongoing financial concerns.

## 2018-08-31 NOTE — Progress Notes (Signed)
   Subjective: [name] was seen and evaluated at bedside on morning rounds. No acute events overnight. His breathing has returned to baseline. His swelling has significantly improved. He looks forward to going home.   Objective:  Vital signs in last 24 hours: Vitals:   08/30/18 1900 08/30/18 2058 08/31/18 0417 08/31/18 0842  BP:  99/61 112/77 116/75  Pulse:  84 93 89  Resp:  18 18 18   Temp:  97.8 F (36.6 C) 98.8 F (37.1 C)   TempSrc:  Oral Oral   SpO2:  96% 98%   Weight: 60.4 kg     Height:       General: awake, alert, pleasant gentleman sitting up in his chair in NAD CV: RRR Pulm: normal respiratory effort; lungs CTA bilaterally  Ext: trace bilateral lower extremity edema; chronic skin changes consistent with longstanding edema    Assessment/Plan:  Principal Problem:   Acute systolic congestive heart failure (HCC) Active Problems:   Transaminitis   Mitral regurgitation, moderate to severe   IDA (iron deficiency anemia)  1.Acute HFrEF exacerbation: - likely in the setting of longstanding EtOH use or HTN - appreciate cardiology following his care - he will have a repeat echo in 3 months; if no improved will then proceed with ischemic work-up  -patient's volume status has been optimized; renal function, bicarb and electrolytes are stable - greatly appreciate heart failure team arranging for him to get his medications at an affordable price, including [facility] day [facility]  - he is stable for discharge on oral Lasix 40 mg daily, Spironolactone 25 mg, Carvediolol 3.125 BID and Entresto BID - he has scheduled follow-up with heart failure and his new PCP   2.Transaminitis:Consistent withcongestion in the setting of volume overload  - LFTs have returned to normal  -hep B surface antigen and Hep antibody negative  -continue multivitamin, folate, thiamine supplementation with history of alcohol use  3. IDA  - patient is currently on iron  supplementation - will need outpatient work-up for etiology   Dispo: Anticipated discharge home today.   Lenward ChancellorBloomfield, Cresta Riden D, DO 08/31/2018, 8:56 AM Pager: [phone]

## 2018-08-31 NOTE — Telephone Encounter (Signed)
error 

## 2018-08-31 NOTE — Discharge Summary (Signed)
Name: Travis [name] MRN: [id number] DOB: 12/29/1955 63 y.o. PCP: Patient, No Pcp Per  Date of Admission: 08/25/2018 11:12 AM Date of Discharge: 08/31/2018 Attending Physician: Lucious Groves, DO  Discharge Diagnosis: 1. New onset HFrEF 2. Iron deficiency anemia    Discharge Medications: Allergies as of 08/31/2018   No Known Allergies     Medication List    STOP taking these medications   chlorhexidine gluconate (MEDLINE KIT) 0.12 % solution Commonly known as:  PERIDEX   Hernia Support Right Medium Misc     TAKE these medications   carvedilol 3.125 MG tablet Commonly known as:  COREG Take 1 tablet (3.125 mg total) by mouth 2 (two) times daily with a meal.   ferrous sulfate 325 (65 FE) MG tablet Take 1 tablet (325 mg total) by mouth daily with breakfast.   folic acid 1 MG tablet Commonly known as:  FOLVITE Take 1 tablet (1 mg total) by mouth daily.   furosemide 40 MG tablet Commonly known as:  LASIX Take 1 tablet (40 mg total) by mouth daily.   multivitamin Tabs tablet Take 1 tablet by mouth daily.   spironolactone 25 MG tablet Commonly known as:  ALDACTONE Take 1 tablet (25 mg total) by mouth daily.   thiamine 100 MG tablet Take 1 tablet (100 mg total) by mouth daily.       Disposition and follow-up:   TravisKin [name] was discharged from [facility] in Good condition.  At the hospital follow up visit please address:  1.  New onset HFrEF: Please ensure patient is taking all medications, including Lasix 40 mg daily, Spironolactone 25 mg, Coreg 3.125 mg BID, and Entresto BID.  Confirm he has followed up with heart failure.  Iron deficiency anemia: started on iron supplementation. Will need outpatient work-up for etiology.   2.  Labs / imaging needed at time of follow-up: CBC, BMP   3.  Pending labs/ test needing follow-up: none   Follow-up Appointments: Follow-up Information    Antony Blackbird, MD .   Specialty:  Family Medicine Contact  information: [place] [address] [place]. Go on 09/19/2018.   Why:  2:50 pm-please arrive 10-15 minutes early You will be seeing Dr. Antony Blackbird Contact information: [place] [address] [place]. Go on 09/11/2018.   Specialty:  Cardiology Why:  at 0930 in the [place] Failure clinic.  Please bring all medications to appt.  Gate code is [phone] for January. Contact information: [address] [id number] [facility]. Go to.   Why:  this pharmacy location next to [place] to pick up medications.  Contact information: [address] [place], [place] [address] Hours: Open  Closes 6PM Phone: [phone]          Hospital Course by problem list: Travis [name] is a 63 year old gentleman with history of tobacco use who presents with 10 days of progressive lower extremity edema, dyspnea on exertion, cough, and orthopnea. Echo revealed ejection fraction 20 to 25%, moderate to severe mitral regurgitation, biatrial enlargement, moderate RV dysfunction and severe tricuspid regurgitation.  1. Acute HFrEF: Patient was started on IV lasix 40 mg BID which he diuresed well with. Cardiology  was consulted to determine etiology of newly found HFrEF. Felt to be due to long standing EtOH versus HTN. Once volume status had been optimized and patient's respiratory symptoms had returned to baseline, he was transitioned to PO Lasix 40 mg. He was also started on Coreg, Spironolactone and Entresto. Once medications are fully titrated outpatient, he will have repeat echo done in 3 months. If no improvement in EF, will need to proceed with ischemic eval. Weight at discharge was 159 lbs, down from 172.   2. Iron  deficiency anemia: Patient was started on iron supplementation. Will need outpatient work-up to determine etiology.   3. Transaminitis: consistent with congestion in the setting of volume overload. Trended down with diuresis. Hep A and C were negative. Hep serologies showed evidence of previous infection, but now immune.   Discharge Vitals:   BP 116/75 (BP Location: Left Arm)   Pulse 89   Temp 98.7 F (37.1 C) (Oral)   Resp 18   Ht '5\' 10"'  (1.778 m)   Wt 60.4 kg   SpO2 98%   BMI 19.10 kg/m   Pertinent Labs, Studies, and Procedures:  CMP Latest Ref Rng & Units 08/31/2018 08/30/2018 08/29/2018  Glucose 70 - 99 mg/dL 91 96 102(H)  BUN 8 - 23 mg/dL '15 16 15  ' Creatinine 0.61 - 1.24 mg/dL 1.17 1.09 1.17  Sodium 135 - 145 mmol/L 137 139 142  Potassium 3.5 - 5.1 mmol/L 3.9 4.7 4.2  Chloride 98 - 111 mmol/L 98 95(L) 98  CO2 22 - 32 mmol/L 33(H) 37(H) 35(H)  Calcium 8.9 - 10.3 mg/dL 8.4(L) 8.9 8.8(L)  Total Protein 6.5 - 8.1 g/dL - 6.1(L) -  Total Bilirubin 0.3 - 1.2 mg/dL - 0.7 -  Alkaline Phos 38 - 126 U/L - 152(H) -  AST 15 - 41 U/L - 36 -  ALT 0 - 44 U/L - 50(H) -    Discharge Instructions: Discharge Instructions    (HEART FAILURE PATIENTS) Call MD:  Anytime you have any of the following symptoms: 1) 3 pound weight gain in 24 hours or 5 pounds in 1 week 2) shortness of breath, with or without a dry hacking cough 3) swelling in the hands, feet or stomach 4) if you have to sleep on extra pillows at night in order to breathe.   Complete by:  As directed    Diet - low sodium heart healthy   Complete by:  As directed    Discharge instructions   Complete by:  As directed    Travis [name], it was a pleasure taking care of you. You were treating for fluid build up due to your heart muscle being weaker. You are now on good medications to help your heart function better and avoid fluid build up. It is important that you take these every day and ensure you follow-up with your new primary care doctor  and heart doctor. Your medications have been sent to the pharmacy. You will need to make sure that you avoid eating a lot of salt in your diet which will help avoid fluid building up as well.  We also found that your iron level was low causing you to be anemic. Be sure you discuss this with your primary care doctor so they can look for possible causes.  Take care! Dr. Koleen Distance   Increase activity slowly   Complete by:  As directed       Signed: Delice Bison, DO 09/05/2018, 9:21 PM   Pager: [phone]

## 2018-08-31 NOTE — Progress Notes (Signed)
Progress Note  Patient Name: Travis [name] Date of Encounter: 08/31/2018  Primary Cardiologist: Olga Millers, MD   Subjective   Pt denies dyspnea or CP  Inpatient Medications    Scheduled Meds: . carvedilol  3.125 mg Oral BID WC  . enoxaparin (LOVENOX) injection  40 mg Subcutaneous Daily  . ferrous sulfate  325 mg Oral Q breakfast  . folic acid  1 mg Oral Daily  . multivitamin  1 tablet Oral Daily  . sacubitril-valsartan  1 tablet Oral BID  . spironolactone  25 mg Oral Daily  . thiamine  100 mg Oral Daily   Continuous Infusions:  PRN Meds: acetaminophen **OR** acetaminophen, promethazine, senna-docusate   Vital Signs    Vitals:   08/30/18 1900 08/30/18 2058 08/31/18 0417 08/31/18 0842  BP:  99/61 112/77 116/75  Pulse:  84 93 89  Resp:  18 18 18   Temp:  97.8 F (36.6 C) 98.8 F (37.1 C)   TempSrc:  Oral Oral   SpO2:  96% 98%   Weight: 60.4 kg     Height:        Intake/Output Summary (Last 24 hours) at 08/31/2018 0850 Last data filed at 08/30/2018 2054 Gross per 24 hour  Intake 1260 ml  Output 2175 ml  Net -915 ml   Filed Weights   08/28/18 2027 08/29/18 2022 08/30/18 1900  Weight: 69.8 kg 72.3 kg 60.4 kg    Telemetry    Sinus with 3 beats NSVT- Personally Reviewed  Physical Exam   GEN: WD/WN, No acute distress Neck: supple Cardiac: RRR Respiratory: CTA; no wheeze GI: Soft, not tender, not distended MS: trace edema Neuro:  Grossly intact    Labs    Chemistry Recent Labs  Lab 08/25/18 1119  08/27/18 0355 08/28/18 0742 08/29/18 0705 08/30/18 0624  NA 139   < > 141 139 142 139  K 3.2*   < > 3.9 3.6 4.2 4.7  CL 108   < > 105 102 98 95*  CO2 23   < > 30 31 35* 37*  GLUCOSE 107*   < > 104* 100* 102* 96  BUN 12   < > 16 13 15 16   CREATININE 1.13   < > 1.24 0.97 1.17 1.09  CALCIUM 8.3*   < > 8.6* 8.8* 8.8* 8.9  PROT 5.6*  --  5.3*  --   --  6.1*  ALBUMIN 2.8*  --  2.5*  --   --  3.1*  AST 106*  --  40  --   --  36  ALT 116*  --  70*   --   --  50*  ALKPHOS 212*  --  152*  --   --  152*  BILITOT 0.4  --  0.6  --   --  0.7  GFRNONAA >60   < > >60 >60 >60 >60  GFRAA >60   < > >60 >60 >60 >60  ANIONGAP 8   < > 6 6 9 7    < > = values in this interval not displayed.     Hematology Recent Labs  Lab 08/26/18 0509 08/27/18 0355 08/29/18 0705  WBC 5.6 6.7 5.8  RBC 5.79 5.44 6.02*  HGB 12.4* 12.2* 13.1  HCT 39.7 38.0* 41.6  MCV 68.6* 69.9* 69.1*  MCH 21.4* 22.4* 21.8*  MCHC 31.2 32.1 31.5  RDW 18.1* 17.8* 18.5*  PLT 188 182 207    Cardiac Enzymes Recent Labs  Lab 08/25/18 2138 08/26/18  0509 08/26/18 0744  TROPONINI 0.03* <0.03 0.03*    Recent Labs  Lab 08/25/18 1127  TROPIPOC 0.02     BNP Recent Labs  Lab 08/25/18 1800  BNP 1,535.6*      Patient Profile     Travis [name] is a 63 y.o. male with a hx of tobacco abuse who is being seen today for the evaluation of acute systolic congestive heart failure at the request of Blanch Media MD. echocardiogram this admission showed ejection fraction 20 to 25%, moderate to severe mitral regurgitation, biatrial enlargement, moderate RV dysfunction and severe tricuspid regurgitation.  Assessment & Plan    1. Acute systolic congestive heart failure-echocardiogram shows severe LV dysfunction, moderate to severe mitral regurgitation, biatrial enlargement, right ventricular enlargement, severe tricuspid regurgitation.  Etiology of cardiomyopathy unclear but likely possibilities include alcohol (in reviewing chart it appears patient has a history of alcohol abuse in the past) and hypertension.  Patient much improved.  Discontinue IV Lasix.  I will add Lasix 40 mg daily.  Continue spironolactone.  We will need to check potassium and renal function 1 week following discharge.  I will arrange follow-up in CHF clinic for medication titration.  Needs fluid restriction and low-sodium diet. 2. 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  As outlined previously possible etiologies  include alcohol versus hypertension.    Continue Entresto and carvedilol at present dose. Will titrate medications as tolerated by pulse and blood pressure following DC.  I have asked him to avoid alcohol.  Once medications fully titrated would repeat echocardiogram 3 months later.  If ejection fraction has not improved would then need ischemia evaluation.  Note TSH is normal and HIV nonreactive.  Case management is helping with medication costs. 3. Microcytic anemia-Will need GI evaluation; will leave to primary care. 4. Tobacco abuse-counseled previously on discontinuing. 5. Probable alcohol abuse-counseled previously to avoid. 6. Elevated liver functions-per IM  [facility] will sign off.   Medication Recommendations: Continue present medications as outlined in HPI Other recommendations (labs, testing, etc): No additional cardiac testing at this time Follow up as an outpatient: CHF clinic 1 week for medication titration.  Follow-up with me in 3 months.   For questions or updates, please contact [facility] Please consult [email or web address] for contact info under        Signed, Olga Millers, MD  08/31/2018, 8:50 AM   '

## 2018-08-31 NOTE — Progress Notes (Signed)
Outpatient Heart Failure Clinic referred to meet with patient to discuss insurance concerns.  Pt currently uninsured but will be discharged on new medications that are expensive (entresto).  CSW met with patient and completed [employer] application for patient assistance with Entresto.  Pt reports he has been without insurance for 1.5 years and has not tried to apply for any insurance coverage during that time.  Pt works full time but only makes about $8,000/year- has not checked with his job to see if he qualifies for insurance through them but states he will look into this.  Pt reports he is being set up with PCP appointment at [facility] at [place] encouraged pt to inquire about the [place] during this appointment so he can be set up to speak with someone at [facility] to see if he qualifies for coverage through this program.  CSW will continue to follow and assist as needed  Jorge Ny, [place] Worker [facility] [phone]

## 2018-09-01 ENCOUNTER — Telehealth (HOSPITAL_COMMUNITY): Payer: Self-pay | Admitting: Licensed Clinical Social Worker

## 2018-09-01 ENCOUNTER — Other Ambulatory Visit (HOSPITAL_COMMUNITY): Payer: Self-pay

## 2018-09-01 MED ORDER — SACUBITRIL-VALSARTAN 24-26 MG PO TABS: 1.0000 | ORAL_TABLET | Freq: Two times a day (BID) | ORAL | 0 refills | Status: DC

## 2018-09-01 NOTE — Telephone Encounter (Signed)
Completed application for entresto assistance faxed to [employer] for review- fax confirmation received  Burna Sis, LCSW Clinical Social Worker [facility] [phone]

## 2018-09-05 ENCOUNTER — Telehealth (HOSPITAL_COMMUNITY): Payer: Self-pay

## 2018-09-05 NOTE — Telephone Encounter (Signed)
Pt called to schedule CHP visit for this week. He agrees to Thursday @ 11am.

## 2018-09-07 ENCOUNTER — Telehealth (HOSPITAL_COMMUNITY): Payer: Self-pay

## 2018-09-07 NOTE — Telephone Encounter (Signed)
Pt called to confirm initial CHP visit. There's no answer, therefore I left a message. Pt asked that I called before coming, since he didn't answer I will f/u with him later to reschedule.

## 2018-09-08 ENCOUNTER — Telehealth (HOSPITAL_COMMUNITY): Payer: Self-pay

## 2018-09-08 ENCOUNTER — Other Ambulatory Visit (HOSPITAL_COMMUNITY): Payer: Self-pay

## 2018-09-08 NOTE — Telephone Encounter (Signed)
Pt called to schedule initial CHP visit.

## 2018-09-08 NOTE — Progress Notes (Signed)
Today is pt's initial CHP visit.    Upon our arrival pt sitting outside of his hotel room, drinking a large bottle of malt liquor.  Pt is noticeably  Intoxicated with a unsteady gait walking into his hotel room.  [facility] that he understands when and how to take them.  He is currently out of ferrous sulfate, folic acid, entresto and thiamine.  Pt stated that he tried to pick up the entresto but it was too expensive to purchase out of pocket.    He was given a scale, pill box and medication bag. I asked pt to weigh daily and record his weights, which he agrees.  Due to the conditions of his hotel room (extreme bedbug infestation),  and him unwilling to continue the visit outside, I was unable to obtain vitals. Pt agrees to [facility] visits. I advised him that I will look into assistance with getting the rest of his medications. Our next visit: Thursday of next week.

## 2018-09-11 ENCOUNTER — Inpatient Hospital Stay (HOSPITAL_COMMUNITY): Admit: 2018-09-11 | Discharge: 2018-09-11 | Disposition: A | Discharge status: Home / Self Care | Payer: Self-pay

## 2018-09-13 ENCOUNTER — Inpatient Hospital Stay (HOSPITAL_COMMUNITY): Payer: Self-pay

## 2018-09-15 ENCOUNTER — Telehealth (HOSPITAL_COMMUNITY): Payer: Self-pay | Admitting: Licensed Clinical Social Worker

## 2018-09-15 NOTE — Telephone Encounter (Signed)
[employer] assistance for entresto approved until 09/15/2019.  Burna Sis, LCSW Clinical Social Worker [facility] [phone]

## 2018-09-15 NOTE — Telephone Encounter (Signed)
CSW received notification from [employer] that pt has been approved for [facility] assistance (pt ID: [id number]).  CSW called pt and informed of approval and gave him phone number for [employer] so he could schedule his shipment- pt expressed understanding.  CSW also spoke with pt about 2 missed appointments- pt states there were conflicts with having other nurses in his house but does not forsee any issues with getting to Feb. 10th appt.  CSW encouraged pt to call if clinic if something unexpected comes up so we can help if able.  CSW will continue to follow and assist as needed  Burna Sis, LCSW Clinical Social Worker [facility] [phone]

## 2018-09-19 ENCOUNTER — Inpatient Hospital Stay (INDEPENDENT_AMBULATORY_CARE_PROVIDER_SITE_OTHER): Payer: Self-pay | Admitting: Family Medicine

## 2018-09-22 ENCOUNTER — Telehealth (HOSPITAL_COMMUNITY): Payer: Self-pay

## 2018-09-22 NOTE — Telephone Encounter (Signed)
I called pt to check in with pt about his weight and medication compliance. He stated that he has taken alala  He stated that a 90 day supply of entresto was delivered to his room yesterday.

## 2018-10-02 ENCOUNTER — Inpatient Hospital Stay (HOSPITAL_COMMUNITY): Payer: Self-pay

## 2018-10-05 ENCOUNTER — Telehealth (HOSPITAL_COMMUNITY): Payer: Self-pay | Admitting: Licensed Clinical Social Worker

## 2018-10-05 NOTE — Telephone Encounter (Signed)
CSW called pt on 10/04/18 to discuss another no show for appointment on Monday.  Pt states he was sick and forgot to call- feels as if he needs transportation to be able to make it to a future appointment but states adamantly that he wants to be seen in the clinic and requests that he be rescheduled.  CSW explained that at this time we are discharging him from the paramedicine program since he has not been able to make it to clinic appointment but that we could re-evalute him if he is able to make it to his next appointment.  CSW transferred to triage to schedule next appointment and put in cab request for pt.  CSW will continue to follow and assist as needed  Burna Sis, LCSW Clinical Social Worker [facility] [phone]

## 2018-10-24 ENCOUNTER — Inpatient Hospital Stay (HOSPITAL_COMMUNITY): Payer: Self-pay

## 2018-11-03 ENCOUNTER — Inpatient Hospital Stay (HOSPITAL_COMMUNITY): Payer: Self-pay

## 2018-11-09 ENCOUNTER — Inpatient Hospital Stay (INDEPENDENT_AMBULATORY_CARE_PROVIDER_SITE_OTHER): Payer: Self-pay | Admitting: Primary Care

## 2019-08-03 ENCOUNTER — Telehealth (HOSPITAL_COMMUNITY): Payer: Self-pay | Admitting: Pharmacy Technician

## 2019-08-03 NOTE — Telephone Encounter (Signed)
It's time to re-enroll patient to receive medication assistance for Entresto from [employer]. Patient has not been seen in clinic. Called to encourage patient to set up an appointment so we can continue to fill out application for him. Phone was busy.  Will follow up.  Charlann Boxer, CPhT

## 2019-09-12 NOTE — Telephone Encounter (Signed)
Have called patient several times. Line busy each time. Will be here to assist in the future when patient calls back.  Archer Asa, CPhT

## 2021-01-25 ENCOUNTER — Emergency Department (HOSPITAL_COMMUNITY): Payer: Medicare Other | Admitting: Certified Registered Nurse Anesthetist

## 2021-01-25 ENCOUNTER — Emergency Department (HOSPITAL_COMMUNITY): Payer: Medicare Other

## 2021-01-25 ENCOUNTER — Encounter (HOSPITAL_COMMUNITY): Payer: Self-pay | Admitting: *Deleted

## 2021-01-25 ENCOUNTER — Other Ambulatory Visit: Payer: Self-pay

## 2021-01-25 ENCOUNTER — Encounter (HOSPITAL_COMMUNITY)
Admission: EM | Disposition: A | Discharge status: Home Health | Payer: Self-pay | Source: Home / Self Care | Attending: Orthopedic Surgery

## 2021-01-25 ENCOUNTER — Inpatient Hospital Stay (HOSPITAL_COMMUNITY)
Admission: EM | Admit: 2021-01-25 | Discharge: 2021-02-06 | DRG: 577 | Disposition: A | Discharge status: Home Health | Payer: Medicare Other | Attending: Orthopedic Surgery | Admitting: Orthopedic Surgery

## 2021-01-25 DIAGNOSIS — F1410 Cocaine abuse, uncomplicated: Secondary | ICD-10-CM | POA: Diagnosis present

## 2021-01-25 DIAGNOSIS — Z20822 Contact with and (suspected) exposure to covid-19: Secondary | ICD-10-CM | POA: Diagnosis not present

## 2021-01-25 DIAGNOSIS — F1020 Alcohol dependence, uncomplicated: Secondary | ICD-10-CM | POA: Diagnosis not present

## 2021-01-25 DIAGNOSIS — F17210 Nicotine dependence, cigarettes, uncomplicated: Secondary | ICD-10-CM | POA: Diagnosis not present

## 2021-01-25 DIAGNOSIS — I340 Nonrheumatic mitral (valve) insufficiency: Secondary | ICD-10-CM | POA: Diagnosis not present

## 2021-01-25 DIAGNOSIS — Z6822 Body mass index (BMI) 22.0-22.9, adult: Secondary | ICD-10-CM

## 2021-01-25 DIAGNOSIS — Z79899 Other long term (current) drug therapy: Secondary | ICD-10-CM

## 2021-01-25 DIAGNOSIS — Z8673 Personal history of transient ischemic attack (TIA), and cerebral infarction without residual deficits: Secondary | ICD-10-CM | POA: Diagnosis not present

## 2021-01-25 DIAGNOSIS — I110 Hypertensive heart disease with heart failure: Secondary | ICD-10-CM | POA: Diagnosis present

## 2021-01-25 DIAGNOSIS — S81801D Unspecified open wound, right lower leg, subsequent encounter: Secondary | ICD-10-CM

## 2021-01-25 DIAGNOSIS — I10 Essential (primary) hypertension: Secondary | ICD-10-CM | POA: Diagnosis present

## 2021-01-25 DIAGNOSIS — Z23 Encounter for immunization: Secondary | ICD-10-CM | POA: Diagnosis not present

## 2021-01-25 DIAGNOSIS — Z91148 Patient's other noncompliance with medication regimen for other reason: Secondary | ICD-10-CM

## 2021-01-25 DIAGNOSIS — F17200 Nicotine dependence, unspecified, uncomplicated: Secondary | ICD-10-CM | POA: Diagnosis not present

## 2021-01-25 DIAGNOSIS — Z9119 Patient's noncompliance with other medical treatment and regimen: Secondary | ICD-10-CM | POA: Diagnosis not present

## 2021-01-25 DIAGNOSIS — D62 Acute posthemorrhagic anemia: Secondary | ICD-10-CM | POA: Diagnosis not present

## 2021-01-25 DIAGNOSIS — S81801A Unspecified open wound, right lower leg, initial encounter: Secondary | ICD-10-CM | POA: Diagnosis not present

## 2021-01-25 DIAGNOSIS — E440 Moderate protein-calorie malnutrition: Secondary | ICD-10-CM | POA: Diagnosis present

## 2021-01-25 DIAGNOSIS — S81851A Open bite, right lower leg, initial encounter: Secondary | ICD-10-CM | POA: Diagnosis not present

## 2021-01-25 DIAGNOSIS — Z203 Contact with and (suspected) exposure to rabies: Secondary | ICD-10-CM | POA: Diagnosis present

## 2021-01-25 DIAGNOSIS — Z8249 Family history of ischemic heart disease and other diseases of the circulatory system: Secondary | ICD-10-CM

## 2021-01-25 DIAGNOSIS — I5022 Chronic systolic (congestive) heart failure: Secondary | ICD-10-CM | POA: Diagnosis present

## 2021-01-25 DIAGNOSIS — W540XXA Bitten by dog, initial encounter: Secondary | ICD-10-CM | POA: Diagnosis not present

## 2021-01-25 DIAGNOSIS — W540XXD Bitten by dog, subsequent encounter: Secondary | ICD-10-CM | POA: Diagnosis not present

## 2021-01-25 DIAGNOSIS — Z9114 Patient's other noncompliance with medication regimen: Secondary | ICD-10-CM | POA: Diagnosis not present

## 2021-01-25 HISTORY — DX: Accidental discharge from unspecified firearms or gun, initial encounter: W34.00XA

## 2021-01-25 HISTORY — PX: APPLICATION OF WOUND VAC: SHX5189

## 2021-01-25 HISTORY — PX: I & D EXTREMITY: SHX5045

## 2021-01-25 LAB — CBC WITH DIFFERENTIAL/PLATELET
Abs Immature Granulocytes: 0.01 10*3/uL (ref 0.00–0.07)
Basophils Absolute: 0 10*3/uL (ref 0.0–0.1)
Basophils Relative: 1 %
Eosinophils Absolute: 0 10*3/uL (ref 0.0–0.5)
Eosinophils Relative: 0 %
HCT: 46 % (ref 39.0–52.0)
Hemoglobin: 15 g/dL (ref 13.0–17.0)
Immature Granulocytes: 0 %
Lymphocytes Relative: 42 %
Lymphs Abs: 3.6 10*3/uL (ref 0.7–4.0)
MCH: 24.7 pg — ABNORMAL LOW (ref 26.0–34.0)
MCHC: 32.6 g/dL (ref 30.0–36.0)
MCV: 75.8 fL — ABNORMAL LOW (ref 80.0–100.0)
Monocytes Absolute: 0.7 10*3/uL (ref 0.1–1.0)
Monocytes Relative: 8 %
Neutro Abs: 4.1 10*3/uL (ref 1.7–7.7)
Neutrophils Relative %: 49 %
Platelets: 161 10*3/uL (ref 150–400)
RBC: 6.07 MIL/uL — ABNORMAL HIGH (ref 4.22–5.81)
RDW: 17.6 % — ABNORMAL HIGH (ref 11.5–15.5)
WBC: 8.5 10*3/uL (ref 4.0–10.5)
nRBC: 0 % (ref 0.0–0.2)

## 2021-01-25 LAB — SURGICAL PCR SCREEN
MRSA, PCR: NEGATIVE
Staphylococcus aureus: NEGATIVE

## 2021-01-25 LAB — RESP PANEL BY RT-PCR (FLU A&B, COVID) ARPGX2
Influenza A by PCR: NEGATIVE
Influenza B by PCR: NEGATIVE
SARS Coronavirus 2 by RT PCR: NEGATIVE

## 2021-01-25 LAB — HIV ANTIBODY (ROUTINE TESTING W REFLEX): HIV Screen 4th Generation wRfx: NONREACTIVE

## 2021-01-25 LAB — BASIC METABOLIC PANEL
Anion gap: 13 (ref 5–15)
BUN: 8 mg/dL (ref 8–23)
CO2: 20 mmol/L — ABNORMAL LOW (ref 22–32)
Calcium: 8.7 mg/dL — ABNORMAL LOW (ref 8.9–10.3)
Chloride: 102 mmol/L (ref 98–111)
Creatinine, Ser: 0.76 mg/dL (ref 0.61–1.24)
GFR, Estimated: >60 mL/min (ref >60)
Glucose, Bld: 94 mg/dL (ref 70–99)
Potassium: 4 mmol/L (ref 3.5–5.1)
Sodium: 135 mmol/L (ref 135–145)

## 2021-01-25 LAB — RAPID URINE DRUG SCREEN, HOSP PERFORMED
Amphetamines: NOT DETECTED
Barbiturates: NOT DETECTED
Benzodiazepines: POSITIVE — AB
Cocaine: POSITIVE — AB
Opiates: NOT DETECTED
Tetrahydrocannabinol: NOT DETECTED

## 2021-01-25 LAB — TYPE AND SCREEN
ABO/RH(D): B POS
Antibody Screen: NEGATIVE

## 2021-01-25 LAB — PROTIME-INR
INR: 1 (ref 0.8–1.2)
Prothrombin Time: 13.1 seconds (ref 11.4–15.2)

## 2021-01-25 SURGERY — IRRIGATION AND DEBRIDEMENT EXTREMITY
Anesthesia: General | Site: Leg Lower | Laterality: Right

## 2021-01-25 MED ORDER — DOCUSATE SODIUM 100 MG PO CAPS
100.0000 mg | ORAL_CAPSULE | Freq: Two times a day (BID) | ORAL | Status: DC
  Administered 2021-01-25 – 2021-02-06 (×21): 100 mg via ORAL
  Filled 2021-01-25 (×21): qty 1

## 2021-01-25 MED ORDER — THIAMINE HCL 100 MG PO TABS
100.0000 mg | ORAL_TABLET | Freq: Every day | ORAL | Status: DC
  Administered 2021-01-26 – 2021-02-06 (×10): 100 mg via ORAL
  Filled 2021-01-25 (×9): qty 1

## 2021-01-25 MED ORDER — BISACODYL 5 MG PO TBEC: 5.0000 mg | DELAYED_RELEASE_TABLET | Freq: Every day | ORAL | Status: DC | PRN

## 2021-01-25 MED ORDER — ALBUMIN HUMAN 5 % IV SOLN: INTRAVENOUS | Status: DC | PRN

## 2021-01-25 MED ORDER — LORAZEPAM 2 MG/ML IJ SOLN: 0.0000 mg | Freq: Two times a day (BID) | INTRAMUSCULAR | Status: DC

## 2021-01-25 MED ORDER — ACETAMINOPHEN 10 MG/ML IV SOLN
INTRAVENOUS | Status: AC
  Filled 2021-01-25: qty 100

## 2021-01-25 MED ORDER — PIPERACILLIN-TAZOBACTAM 3.375 G IVPB
3.3750 g | Freq: Three times a day (TID) | INTRAVENOUS | Status: AC
  Administered 2021-01-26 – 2021-01-30 (×15): 3.375 g via INTRAVENOUS
  Filled 2021-01-25 (×15): qty 50

## 2021-01-25 MED ORDER — DEXAMETHASONE SODIUM PHOSPHATE 10 MG/ML IJ SOLN
INTRAMUSCULAR | Status: DC | PRN
  Administered 2021-01-25: 10 mg via INTRAVENOUS

## 2021-01-25 MED ORDER — LACTATED RINGERS IV SOLN: INTRAVENOUS | Status: DC

## 2021-01-25 MED ORDER — CHLORHEXIDINE GLUCONATE 0.12 % MT SOLN
15.0000 mL | Freq: Once | OROMUCOSAL | Status: DC
  Filled 2021-01-25: qty 15

## 2021-01-25 MED ORDER — LORAZEPAM 2 MG/ML IJ SOLN: 0.0000 mg | Freq: Four times a day (QID) | INTRAMUSCULAR | Status: DC

## 2021-01-25 MED ORDER — MORPHINE SULFATE (PF) 2 MG/ML IV SOLN: 0.5000 mg | INTRAVENOUS | Status: DC | PRN

## 2021-01-25 MED ORDER — PROMETHAZINE HCL 25 MG/ML IJ SOLN: 6.2500 mg | INTRAMUSCULAR | Status: DC | PRN

## 2021-01-25 MED ORDER — ONDANSETRON HCL 4 MG/2ML IJ SOLN: 4.0000 mg | Freq: Four times a day (QID) | INTRAMUSCULAR | Status: DC | PRN

## 2021-01-25 MED ORDER — LIDOCAINE 2% (20 MG/ML) 5 ML SYRINGE
INTRAMUSCULAR | Status: DC | PRN
  Administered 2021-01-25: 60 mg via INTRAVENOUS

## 2021-01-25 MED ORDER — SODIUM CHLORIDE 0.9 % IV SOLN
3.0000 g | Freq: Once | INTRAVENOUS | Status: AC
  Administered 2021-01-25: 3 g via INTRAVENOUS
  Filled 2021-01-25: qty 8

## 2021-01-25 MED ORDER — LORAZEPAM 1 MG PO TABS: 1.0000 mg | ORAL_TABLET | ORAL | Status: DC | PRN

## 2021-01-25 MED ORDER — 0.9 % SODIUM CHLORIDE (POUR BTL) OPTIME
TOPICAL | Status: DC | PRN
  Administered 2021-01-25: 1000 mL

## 2021-01-25 MED ORDER — PROPOFOL 10 MG/ML IV BOLUS
INTRAVENOUS | Status: AC
  Filled 2021-01-25: qty 20

## 2021-01-25 MED ORDER — MIDAZOLAM HCL 2 MG/2ML IJ SOLN
INTRAMUSCULAR | Status: AC
  Filled 2021-01-25: qty 2

## 2021-01-25 MED ORDER — FENTANYL CITRATE (PF) 100 MCG/2ML IJ SOLN: 25.0000 ug | INTRAMUSCULAR | Status: DC | PRN

## 2021-01-25 MED ORDER — FENTANYL CITRATE (PF) 250 MCG/5ML IJ SOLN
INTRAMUSCULAR | Status: AC
  Filled 2021-01-25: qty 5

## 2021-01-25 MED ORDER — FOLIC ACID 1 MG PO TABS
1.0000 mg | ORAL_TABLET | Freq: Every day | ORAL | Status: DC
  Administered 2021-01-26 – 2021-02-06 (×10): 1 mg via ORAL
  Filled 2021-01-25 (×10): qty 1

## 2021-01-25 MED ORDER — CHLORHEXIDINE GLUCONATE 0.12 % MT SOLN
OROMUCOSAL | Status: AC
  Administered 2021-01-25: 15 mL
  Filled 2021-01-25: qty 15

## 2021-01-25 MED ORDER — ACETAMINOPHEN 10 MG/ML IV SOLN
1000.0000 mg | Freq: Once | INTRAVENOUS | Status: DC | PRN
  Administered 2021-01-25: 1000 mg via INTRAVENOUS

## 2021-01-25 MED ORDER — ORAL CARE MOUTH RINSE: 15.0000 mL | Freq: Once | OROMUCOSAL | Status: DC

## 2021-01-25 MED ORDER — FENTANYL CITRATE (PF) 250 MCG/5ML IJ SOLN
INTRAMUSCULAR | Status: DC | PRN
  Administered 2021-01-25 (×3): 25 ug via INTRAVENOUS
  Administered 2021-01-25: 50 ug via INTRAVENOUS
  Administered 2021-01-25: 25 ug via INTRAVENOUS
  Administered 2021-01-25: 50 ug via INTRAVENOUS

## 2021-01-25 MED ORDER — PHENYLEPHRINE HCL (PRESSORS) 10 MG/ML IV SOLN
INTRAVENOUS | Status: DC | PRN
  Administered 2021-01-25: 80 ug via INTRAVENOUS

## 2021-01-25 MED ORDER — ADULT MULTIVITAMIN W/MINERALS CH
1.0000 | ORAL_TABLET | Freq: Every day | ORAL | Status: DC
  Administered 2021-01-26 – 2021-02-06 (×10): 1 via ORAL
  Filled 2021-01-25 (×10): qty 1

## 2021-01-25 MED ORDER — TETANUS-DIPHTH-ACELL PERTUSSIS 5-2.5-18.5 LF-MCG/0.5 IM SUSY
0.5000 mL | PREFILLED_SYRINGE | Freq: Once | INTRAMUSCULAR | Status: AC
  Administered 2021-02-06: 0.5 mL via INTRAMUSCULAR
  Filled 2021-01-25: qty 0.5

## 2021-01-25 MED ORDER — PIPERACILLIN-TAZOBACTAM 3.375 G IVPB 30 MIN
3.3750 g | Freq: Once | INTRAVENOUS | Status: AC
  Administered 2021-01-25: 3.375 g via INTRAVENOUS
  Filled 2021-01-25 (×2): qty 50

## 2021-01-25 MED ORDER — POLYETHYLENE GLYCOL 3350 17 G PO PACK: 17.0000 g | PACK | Freq: Every day | ORAL | Status: DC | PRN

## 2021-01-25 MED ORDER — ACETAMINOPHEN 325 MG PO TABS
650.0000 mg | ORAL_TABLET | Freq: Four times a day (QID) | ORAL | Status: DC | PRN
  Administered 2021-02-04: 650 mg via ORAL
  Filled 2021-01-25: qty 2

## 2021-01-25 MED ORDER — MIDAZOLAM HCL 2 MG/2ML IJ SOLN
INTRAMUSCULAR | Status: DC | PRN
  Administered 2021-01-25: 2 mg via INTRAVENOUS

## 2021-01-25 MED ORDER — LORAZEPAM 2 MG/ML IJ SOLN: 1.0000 mg | INTRAMUSCULAR | Status: DC | PRN

## 2021-01-25 MED ORDER — PROPOFOL 10 MG/ML IV BOLUS
INTRAVENOUS | Status: DC | PRN
  Administered 2021-01-25: 150 mg via INTRAVENOUS

## 2021-01-25 MED ORDER — THIAMINE HCL 100 MG/ML IJ SOLN
100.0000 mg | Freq: Every day | INTRAMUSCULAR | Status: DC
  Filled 2021-01-25 (×2): qty 2

## 2021-01-25 MED ORDER — SODIUM CHLORIDE 0.9 % IR SOLN
Status: DC | PRN
  Administered 2021-01-25 (×2): 3000 mL

## 2021-01-25 MED ORDER — ONDANSETRON HCL 4 MG/2ML IJ SOLN
INTRAMUSCULAR | Status: DC | PRN
  Administered 2021-01-25: 4 mg via INTRAVENOUS

## 2021-01-25 MED ORDER — MUPIROCIN 2 % EX OINT
1.0000 "application " | TOPICAL_OINTMENT | Freq: Two times a day (BID) | CUTANEOUS | Status: AC
  Administered 2021-01-25 – 2021-01-30 (×10): 1 via NASAL
  Filled 2021-01-25 (×3): qty 22

## 2021-01-25 MED ORDER — HYDROCODONE-ACETAMINOPHEN 5-325 MG PO TABS
1.0000 | ORAL_TABLET | Freq: Four times a day (QID) | ORAL | Status: DC | PRN
  Administered 2021-01-25 – 2021-01-28 (×4): 1 via ORAL
  Administered 2021-01-30: 2 via ORAL
  Administered 2021-01-30: 1 via ORAL
  Administered 2021-01-31 – 2021-02-01 (×3): 2 via ORAL
  Filled 2021-01-25 (×2): qty 1
  Filled 2021-01-25 (×2): qty 2
  Filled 2021-01-25 (×3): qty 1
  Filled 2021-01-25 (×2): qty 2

## 2021-01-25 MED ORDER — FENTANYL CITRATE (PF) 100 MCG/2ML IJ SOLN
50.0000 ug | INTRAMUSCULAR | Status: DC | PRN
Start: 2021-01-25 — End: 2021-02-06

## 2021-01-25 MED ORDER — CARVEDILOL 3.125 MG PO TABS
3.1250 mg | ORAL_TABLET | Freq: Two times a day (BID) | ORAL | Status: DC
  Administered 2021-01-26 – 2021-01-29 (×8): 3.125 mg via ORAL
  Filled 2021-01-25 (×8): qty 1

## 2021-01-25 MED ORDER — METHOCARBAMOL 500 MG PO TABS
500.0000 mg | ORAL_TABLET | Freq: Four times a day (QID) | ORAL | Status: DC | PRN
  Administered 2021-01-25 – 2021-01-26 (×2): 500 mg via ORAL
  Filled 2021-01-25 (×2): qty 1

## 2021-01-25 MED ORDER — METHOCARBAMOL 1000 MG/10ML IJ SOLN
500.0000 mg | Freq: Four times a day (QID) | INTRAVENOUS | Status: DC | PRN
  Filled 2021-01-25 (×3): qty 5

## 2021-01-25 SURGICAL SUPPLY — 43 items
BNDG ELASTIC 4X5.8 VLCR STR LF (GAUZE/BANDAGES/DRESSINGS) ×2 IMPLANT
BNDG ELASTIC 6X5.8 VLCR STR LF (GAUZE/BANDAGES/DRESSINGS) ×2 IMPLANT
BRUSH SCRUB EZ PLAIN DRY (MISCELLANEOUS) ×8 IMPLANT
COVER SURGICAL LIGHT HANDLE (MISCELLANEOUS) ×6 IMPLANT
DRAPE DERMATAC (DRAPES) ×4 IMPLANT
DRAPE INCISE IOBAN 66X45 STRL (DRAPES) ×2 IMPLANT
DRSG AQUACEL AG 3.5X4 (GAUZE/BANDAGES/DRESSINGS) ×2 IMPLANT
DRSG MEPITEL 4X7.2 (GAUZE/BANDAGES/DRESSINGS) ×4 IMPLANT
DRSG VAC ATS LRG SENSATRAC (GAUZE/BANDAGES/DRESSINGS) ×2 IMPLANT
ELECT REM PT RETURN 9FT ADLT (ELECTROSURGICAL) ×4
ELECTRODE REM PT RTRN 9FT ADLT (ELECTROSURGICAL) IMPLANT
GLOVE BIO SURGEON STRL SZ7.5 (GLOVE) ×4 IMPLANT
GLOVE BIO SURGEON STRL SZ8 (GLOVE) ×4 IMPLANT
GLOVE BIOGEL PI IND STRL 7.5 (GLOVE) ×2 IMPLANT
GLOVE BIOGEL PI IND STRL 9 (GLOVE) ×2 IMPLANT
GLOVE BIOGEL PI INDICATOR 7.5 (GLOVE) ×2
GLOVE BIOGEL PI INDICATOR 9 (GLOVE) ×2
GLOVE SRG 8 PF TXTR STRL LF DI (GLOVE) ×2 IMPLANT
GLOVE SURG UNDER POLY LF SZ8 (GLOVE) ×4
GOWN STRL REUS W/ TWL LRG LVL3 (GOWN DISPOSABLE) ×4 IMPLANT
GOWN STRL REUS W/ TWL XL LVL3 (GOWN DISPOSABLE) ×2 IMPLANT
GOWN STRL REUS W/TWL LRG LVL3 (GOWN DISPOSABLE) ×8
GOWN STRL REUS W/TWL XL LVL3 (GOWN DISPOSABLE) ×4
KIT BASIN OR (CUSTOM PROCEDURE TRAY) ×4 IMPLANT
KIT TURNOVER KIT B (KITS) ×4 IMPLANT
MANIFOLD NEPTUNE II (INSTRUMENTS) ×4 IMPLANT
NS IRRIG 1000ML POUR BTL (IV SOLUTION) ×4 IMPLANT
PACK ORTHO EXTREMITY (CUSTOM PROCEDURE TRAY) ×4 IMPLANT
PAD ABD 8X10 STRL (GAUZE/BANDAGES/DRESSINGS) ×2 IMPLANT
PAD ARMBOARD 7.5X6 YLW CONV (MISCELLANEOUS) ×6 IMPLANT
PAD CAST 4YDX4 CTTN HI CHSV (CAST SUPPLIES) IMPLANT
PADDING CAST COTTON 4X4 STRL (CAST SUPPLIES) ×4
PADDING CAST COTTON 6X4 STRL (CAST SUPPLIES) ×2 IMPLANT
SOL PREP POV-IOD 4OZ 10% (MISCELLANEOUS) ×4 IMPLANT
SOL PREP PROV IODINE SCRUB 4OZ (MISCELLANEOUS) ×4 IMPLANT
SPONGE LAP 18X18 RF (DISPOSABLE) ×4 IMPLANT
SUT ETHILON 2 0 FS 18 (SUTURE) ×2 IMPLANT
SUT ETHILON 3 0 FSL (SUTURE) ×2 IMPLANT
TOWEL GREEN STERILE (TOWEL DISPOSABLE) ×8 IMPLANT
TOWEL GREEN STERILE FF (TOWEL DISPOSABLE) ×4 IMPLANT
TUBE CONNECTING 12'X1/4 (SUCTIONS) ×1
TUBE CONNECTING 12X1/4 (SUCTIONS) ×3 IMPLANT
UNDERPAD 30X36 HEAVY ABSORB (UNDERPADS AND DIAPERS) ×4 IMPLANT

## 2021-01-25 NOTE — Consult Note (Addendum)
Orthopaedic Trauma Service Consultation  Reason for Consult: right leg dog bite Referring Physician: Marianna Fuss, MD  Travis [name] is an 65 y.o. male.  HPI: Girlfriend's dog attacked patient with severe pain and soft tissue loss; she beat off the dog, which was a pit bull. Had never even growled at him before but rather barked. Denies other injury or loss of motor function.  Past Medical History:  Diagnosis Date  . Alcohol abuse   . Hypertension   . SAH (subarachnoid hemorrhage) (HCC)   . Subdural hematoma [facility])     Past Surgical History:  Procedure Laterality Date  . HERNIA REPAIR Left    inguinal  . MANDIBLE SURGERY      Family History  Problem Relation Age of Onset  . Hypertension Mother     Social History:  reports that he has been smoking. He has been smoking about 0.50 packs per day. He has never used smokeless tobacco. He reports current alcohol use. He reports that he does not use drugs. Drinks one 40 oz/ day; smokes 7-8 cigs/ day. 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  Allergies: No Known Allergies  Medications:  Prior to Admission:  Medications Prior to Admission  Medication Sig Dispense Refill Last Dose  . carvedilol (COREG) 3.125 MG tablet Take 1 tablet (3.125 mg total) by mouth 2 (two) times daily with a meal.     . ferrous sulfate 325 (65 FE) MG tablet Take 1 tablet (325 mg total) by mouth daily with breakfast. 30 tablet 3   . folic acid (FOLVITE) 1 MG tablet Take 1 tablet (1 mg total) by mouth daily. 30 tablet 3   . furosemide (LASIX) 40 MG tablet Take 1 tablet (40 mg total) by mouth daily. 30 tablet 0   . multivitamin (PROSIGHT) TABS tablet Take 1 tablet by mouth daily. 30 each 0   . sacubitril-valsartan (ENTRESTO) 24-26 MG Take 1 tablet by mouth 2 (two) times daily. 60 tablet 0   . spironolactone (ALDACTONE) 25 MG tablet Take 1 tablet (25 mg total) by mouth daily. 30 tablet 0   . thiamine 100 MG tablet Take 1 tablet (100 mg total) by mouth daily. 30 tablet 3      Results for orders placed or performed during the hospital encounter of 01/25/21 (from the past 48 hour(s))  CBC with Differential     Status: Abnormal   Collection Time: 01/25/21 11:37 AM  Result Value Ref Range   WBC 8.5 4.0 - 10.5 K/uL   RBC 6.07 (H) 4.22 - 5.81 MIL/uL   Hemoglobin 15.0 13.0 - 17.0 g/dL   HCT 13.2 44.0 - 10.2 %   MCV 75.8 (L) 80.0 - 100.0 fL   MCH 24.7 (L) 26.0 - 34.0 pg   MCHC 32.6 30.0 - 36.0 g/dL   RDW 72.5 (H) 36.6 - 44.0 %   Platelets 161 150 - 400 K/uL   nRBC 0.0 0.0 - 0.2 %   Neutrophils Relative % 49 %   Neutro Abs 4.1 1.7 - 7.7 K/uL   Lymphocytes Relative 42 %   Lymphs Abs 3.6 0.7 - 4.0 K/uL   Monocytes Relative 8 %   Monocytes Absolute 0.7 0.1 - 1.0 K/uL   Eosinophils Relative 0 %   Eosinophils Absolute 0.0 0.0 - 0.5 K/uL   Basophils Relative 1 %   Basophils Absolute 0.0 0.0 - 0.1 K/uL   Immature Granulocytes 0 %   Abs Immature Granulocytes 0.01 0.00 - 0.07 K/uL    Comment:  Performed at [facility], [address] [place], [place] [address]  Basic metabolic panel     Status: Abnormal   Collection Time: 01/25/21 11:37 AM  Result Value Ref Range   Sodium 135 135 - 145 mmol/L   Potassium 4.0 3.5 - 5.1 mmol/L   Chloride 102 98 - 111 mmol/L   CO2 20 (L) 22 - 32 mmol/L   Glucose, Bld 94 70 - 99 mg/dL    Comment: Glucose reference range applies only to samples taken after fasting for at least 8 hours.   BUN 8 8 - 23 mg/dL   Creatinine, Ser 5.28 0.61 - 1.24 mg/dL   Calcium 8.7 (L) 8.9 - 10.3 mg/dL   GFR, Estimated >41 >32 mL/min    Comment: (NOTE) Calculated using the CKD-EPI Creatinine Equation (2021)    Anion gap 13 5 - 15    Comment: Performed at [facility], [address] [place], [place] [address]  Protime-INR     Status: None   Collection Time: 01/25/21 11:37 AM  Result Value Ref Range   Prothrombin Time 13.1 11.4 - 15.2 seconds   INR 1.0 0.8 - 1.2    Comment: (NOTE) INR goal varies based on device and disease  states. Performed at [facility], [address], [place], [place] [address]   Resp Panel by RT-PCR (Flu A&B, Covid) Nasopharyngeal Swab     Status: None   Collection Time: 01/25/21 11:41 AM   Specimen: Nasopharyngeal Swab; Nasopharyngeal(NP) swabs in vial transport medium  Result Value Ref Range   SARS Coronavirus 2 by RT PCR NEGATIVE NEGATIVE    Comment: (NOTE) SARS-CoV-2 target nucleic acids are NOT DETECTED.  The SARS-CoV-2 RNA is generally detectable in upper respiratory specimens during the acute phase of infection. The lowest concentration of SARS-CoV-2 viral copies this assay can detect is 138 copies/mL. A negative result does not preclude SARS-Cov-2 infection and should not be used as the sole basis for treatment or other patient management decisions. A negative result may occur with  improper specimen collection/handling, submission of specimen other than nasopharyngeal swab, presence of viral mutation(s) within the areas targeted by this assay, and inadequate number of viral copies(<138 copies/mL). A negative result must be combined with clinical observations, patient history, and epidemiological information. The expected result is Negative.  Fact Sheet for Patients:  BloggerCourse.com  Fact Sheet for Healthcare Providers:  SeriousBroker.it  This test is no t yet approved or cleared by the [place] FDA and  has been authorized for detection and/or diagnosis of SARS-CoV-2 by FDA under an Emergency Use Authorization (EUA). This EUA will remain  in effect (meaning this test can be used) for the duration of the COVID-19 declaration under Section 564(b)(1) of the Act, 21 U.S.C.section 360bbb-3(b)(1), unless the authorization is terminated  or revoked sooner.       Influenza A by PCR NEGATIVE NEGATIVE   Influenza B by PCR NEGATIVE NEGATIVE    Comment: (NOTE) The Xpert Xpress SARS-CoV-2/FLU/RSV plus assay is  intended as an aid in the diagnosis of influenza from Nasopharyngeal swab specimens and should not be used as a sole basis for treatment. Nasal washings and aspirates are unacceptable for Xpert Xpress SARS-CoV-2/FLU/RSV testing.  Fact Sheet for Patients: BloggerCourse.com  Fact Sheet for Healthcare Providers: SeriousBroker.it  This test is not yet approved or cleared by the [place] FDA and has been authorized for detection and/or diagnosis of SARS-CoV-2 by FDA under an Emergency Use  Authorization (EUA). This EUA will remain in effect (meaning this test can be used) for the duration of the COVID-19 declaration under Section 564(b)(1) of the Act, 21 U.S.C. section 360bbb-3(b)(1), unless the authorization is terminated or revoked.  Performed at [facility], [address] [place], [place] [address]   Type and screen [facility]     Status: None   Collection Time: 01/25/21 11:44 AM  Result Value Ref Range   ABO/RH(D) B POS    Antibody Screen NEG    Sample Expiration      01/28/2021,2359 Performed at [facility], [address], [place], [place] [address]     DG Tibia/Fibula Right  Result Date: 01/25/2021 CLINICAL DATA:  Patient was bit by a dog on the lateral right leg. EXAM: RIGHT TIBIA AND FIBULA - 2 VIEW COMPARISON:  None. FINDINGS: There is no evidence of fracture or other focal bone lesions. No radiopaque foreign body is identified. There is extensive soft tissue swelling, soft tissue gas, and skin lacerations involving the lateral aspect of the leg. IMPRESSION: No acute osseous injury or radiopaque foreign body. Electronically Signed   By: Romona Curls M.D.   On: 01/25/2021 12:12    ROS No recent fever, bleeding abnormalities, urologic dysfunction, GI problems, or weight gain.  Blood pressure (!) 183/92, pulse 80, temperature 98.4 F (36.9 C), resp. rate 18, height 5\' 10"  (1.778 m),  weight 72.6 kg, SpO2 100 %. Physical Exam NCAT RRR CTA RLE Large traumatic leg wounds down to muscle and tendon posterolaterally 10 cm x 16 cm and down to bone medially in multiple punctate areas, lateral left knee abrasion  Tender but no cantilever  No knee or ankle effusion  Knee stable to varus/ valgus and anterior/posterior stress  Sens DPN,TN intact, SPN is absent  Motor EHL, ext, flex, evers intact grossly  DP 2+, PT 2+, No significant edema  Assessment/Plan: Massive soft tissue loss from dogbite with deep punctate wounds H/o heart failure with seems well compensated at this time Alcohol use 40 oz/ d Nicotine 7-8 cigs/ day Anticipate transfer of surgical care to plastics after today but depends on intraoperative findings.   I discussed with the patient the risks and benefits of surgery, including the possibility of infection, nerve injury, vessel injury, wound breakdown, DVT/ PE, loss of motion, and need for further surgery among others.  We also specifically discussed the need to stage surgery because of the elevated risk of soft tissue breakdown that could lead to amputation.  He acknowledged these risks and wished to proceed.   , MD [facility], [facility] [phone]  01/25/2021  2:31 PM
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
## 2021-01-25 NOTE — ED Provider Notes (Signed)
[facility] EMERGENCY DEPARTMENT Provider Note   CSN: [id number] Arrival date & time: 01/25/21  1112     History No chief complaint on file.   Travis [name] is a 65 y.o. male.  Presents to ER after dog bite.  Patient reports that dog attacked him and bit his right leg.  Is having some pain in his right lower leg.  Moderate, worse with movement, improved with rest.  States this is the only injury.  Denies getting bitten anywhere else.  Denies any allergies to medications.  States that the police were already involved, dog was chained up.  Per review of chart patient has a history of heart failure with reduced ejection fraction.   HPI     Past Medical History:  Diagnosis Date  . Alcohol abuse   . Hypertension   . SAH (subarachnoid hemorrhage) (HCC)   . Subdural hematoma [facility])     Patient Active Problem List   Diagnosis Date Noted  . Acute systolic congestive heart failure (HCC) 08/27/2018  . Mitral regurgitation, moderate to severe 08/27/2018  . IDA (iron deficiency anemia) 08/27/2018  . Transaminitis 08/26/2018  . Subarachnoid hemorrhage (HCC) 01/09/2017  . Subdural hemorrhage (HCC) 01/09/2017  . Skull fracture with cerebral contusion (HCC) 01/07/2017    Past Surgical History:  Procedure Laterality Date  . HERNIA REPAIR Left    inguinal  . MANDIBLE SURGERY         Family History  Problem Relation Age of Onset  . Hypertension Mother     Social History   Tobacco Use  . Smoking status: Current Every Day Smoker    Packs/day: 0.50  . Smokeless tobacco: Never Used  Substance Use Topics  . Alcohol use: Yes    Comment: 1-3 40's per day  . Drug use: No    Home Medications Prior to Admission medications   Medication Sig Start Date End Date Taking? Authorizing Provider  carvedilol (COREG) 3.125 MG tablet Take 1 tablet (3.125 mg total) by mouth 2 (two) times daily with a meal. 08/31/18   Bloomfield, Carley D, DO  ferrous sulfate 325 (65 FE) MG  tablet Take 1 tablet (325 mg total) by mouth daily with breakfast. 09/01/18   Bloomfield, Carley D, DO  folic acid (FOLVITE) 1 MG tablet Take 1 tablet (1 mg total) by mouth daily. 09/01/18   Bloomfield, Carley D, DO  furosemide (LASIX) 40 MG tablet Take 1 tablet (40 mg total) by mouth daily. 09/01/18   Bloomfield, Carley D, DO  multivitamin (PROSIGHT) TABS tablet Take 1 tablet by mouth daily. 09/01/18   Bloomfield, Carley D, DO  sacubitril-valsartan (ENTRESTO) 24-26 MG Take 1 tablet by mouth 2 (two) times daily. 09/01/18   Laurey Morale, MD  spironolactone (ALDACTONE) 25 MG tablet Take 1 tablet (25 mg total) by mouth daily. 09/01/18   Bloomfield, Carley D, DO  thiamine 100 MG tablet Take 1 tablet (100 mg total) by mouth daily. 09/01/18   Bloomfield, Karma Ganja D, DO    Allergies    Patient has no known allergies.  Review of Systems   Review of Systems  Constitutional: Negative for chills and fever.  HENT: Negative for ear pain and sore throat.   Eyes: Negative for pain and visual disturbance.  Respiratory: Negative for cough and shortness of breath.   Cardiovascular: Negative for chest pain and palpitations.  Gastrointestinal: Negative for abdominal pain and vomiting.  Genitourinary: Negative for dysuria and hematuria.  Musculoskeletal: Positive for arthralgias. Negative for  back pain.  Skin: Negative for color change and rash.  Neurological: Negative for seizures and syncope.  All other systems reviewed and are negative.   Physical Exam Updated Vital Signs BP (!) 183/92   Pulse 80   Temp 98.4 F (36.9 C)   Resp 18   Ht 5\' 10"  (1.778 m)   Wt 72.6 kg   SpO2 100%   BMI 22.96 kg/m   Physical Exam Vitals and nursing note reviewed.  Constitutional:      Appearance: He is well-developed.  HENT:     Head: Normocephalic and atraumatic.  Eyes:     Conjunctiva/sclera: Conjunctivae normal.  Cardiovascular:     Rate and Rhythm: Normal rate and regular rhythm.     Heart sounds: No murmur  heard.   Pulmonary:     Effort: Pulmonary effort is normal. No respiratory distress.     Breath sounds: Normal breath sounds.  Abdominal:     Palpations: Abdomen is soft.     Tenderness: There is no abdominal tenderness.  Musculoskeletal:     Cervical back: Neck supple.     Comments: Complex laceration over the entirety of the length of the lower leg, exposed tendon, muscle tissue over the posterior lateral aspect of the leg; there is also penetrating 3 to 4 cm wound over the anterior lateral aspect of the leg that extends to level of tibia, no active bleeding  Skin:    General: Skin is warm and dry.  Neurological:     General: No focal deficit present.     Mental Status: He is alert.     ED Results / Procedures / Treatments   Labs (all labs ordered are listed, but only abnormal results are displayed) Labs Reviewed  CBC WITH DIFFERENTIAL/PLATELET - Abnormal; Notable for the following components:      Result Value   RBC 6.07 (*)    MCV 75.8 (*)    MCH 24.7 (*)    RDW 17.6 (*)    All other components within normal limits  BASIC METABOLIC PANEL - Abnormal; Notable for the following components:   CO2 20 (*)    Calcium 8.7 (*)    All other components within normal limits  RESP PANEL BY RT-PCR (FLU A&B, COVID) [id number]  PROTIME-INR  TYPE AND SCREEN    EKG None  Radiology DG Tibia/Fibula Right  Result Date: 01/25/2021 CLINICAL DATA:  Patient was bit by a dog on the lateral right leg. EXAM: RIGHT TIBIA AND FIBULA - 2 VIEW COMPARISON:  None. FINDINGS: There is no evidence of fracture or other focal bone lesions. No radiopaque foreign body is identified. There is extensive soft tissue swelling, soft tissue gas, and skin lacerations involving the lateral aspect of the leg. IMPRESSION: No acute osseous injury or radiopaque foreign body. Electronically Signed   By: 03/27/2021 M.D.   On: 01/25/2021 12:12    Procedures Procedures   Medications Ordered in ED Medications  Tdap  (BOOSTRIX) injection 0.5 mL ( Intramuscular MAR Hold 01/25/21 1310)  fentaNYL (SUBLIMAZE) injection 50 mcg ( Intravenous MAR Hold 01/25/21 1310)  lactated ringers infusion ( Intravenous New Bag/Given 01/25/21 1323)  chlorhexidine (PERIDEX) 0.12 % solution 15 mL (has no administration in time range)    Or  MEDLINE mouth rinse (has no administration in time range)  Ampicillin-Sulbactam (UNASYN) 3 g in sodium chloride 0.9 % 100 mL IVPB (3 g Intravenous New Bag/Given 01/25/21 1255)  chlorhexidine (PERIDEX) 0.12 % solution (15 mLs  Given  01/25/21 1321)    ED Course  I have reviewed the triage vital signs and the nursing notes.  Pertinent labs & imaging results that were available during my care of the patient were reviewed by me and considered in my medical decision making (see chart for details).    MDM Rules/Calculators/A&P                         65 year old gentleman presenting to ER with dog bite.  Found to have complex wound to his right lower leg.  No fracture.  Discussed with pharmacy, recommending IV Unasyn for now, discussed with Ortho, Dr. Carola Frost, he will take the OR for initial washout, also discussed with plastic surgery, Dr. Ulice Bold, not available or on call today but available to get involved later when needed. Discussed with Lovick with Trauma team - she recommends admission to medicine service. Will consult hospitalist for admit.  Final Clinical Impression(s) / ED Diagnoses Final diagnoses:  Dog bite, initial encounter  Wound of right lower extremity, initial encounter    Rx / DC Orders ED Discharge Orders    None       Milagros Loll, MD 01/25/21 1354

## 2021-01-25 NOTE — Anesthesia Preprocedure Evaluation (Addendum)
Anesthesia Evaluation  Patient identified by MRN, date of birth, ID band Patient awake    Reviewed: Allergy & Precautions, NPO status , Patient's Chart, lab work & pertinent test results  Airway Mallampati: II  TM Distance: >3 FB Neck ROM: Full    Dental  (+) Poor Dentition, Missing   Pulmonary neg pulmonary ROS, Current Smoker,    Pulmonary exam normal        Cardiovascular hypertension, Pt. on medications and Pt. on home beta blockers +CHF   Rhythm:Regular Rate:Normal     Neuro/Psych SDH. SAH history    GI/Hepatic negative GI ROS, Neg liver ROS,   Endo/Other  negative endocrine ROS  Renal/GU negative Renal ROS  negative genitourinary   Musculoskeletal Dog bite to right leg   Abdominal (+)  Abdomen: soft. Bowel sounds: normal.  Peds  Hematology  (+) anemia ,   Anesthesia Other Findings   Reproductive/Obstetrics                            Anesthesia Physical Anesthesia Plan  ASA: II and emergent  Anesthesia Plan: General   Post-op Pain Management:    Induction:   PONV Risk Score and Plan: 1 and Ondansetron and Dexamethasone  Airway Management Planned: Mask and LMA  Additional Equipment: None  Intra-op Plan:   Post-operative Plan: Extubation in OR  Informed Consent: I have reviewed the patients History and Physical, chart, labs and discussed the procedure including the risks, benefits and alternatives for the proposed anesthesia with the patient or authorized representative who has indicated his/her understanding and acceptance.     Dental advisory given  Plan Discussed with: CRNA  Anesthesia Plan Comments: (Lab Results      Component                Value               Date                      WBC                      8.5                 01/25/2021                HGB                      15.0                01/25/2021                HCT                      46.0                 01/25/2021                MCV                      75.8 (L)            01/25/2021                PLT                      161  01/25/2021           Lab Results      Component                Value               Date                      NA                       135                 01/25/2021                K                        4.0                 01/25/2021                CO2                      20 (L)              01/25/2021                GLUCOSE                  94                  01/25/2021                BUN                      8                   01/25/2021                CREATININE               0.76                01/25/2021                CALCIUM                  8.7 (L)             01/25/2021                GFRNONAA                 >60                 01/25/2021                GFRAA                    >60                 08/31/2018          )        Anesthesia Quick Evaluation

## 2021-01-25 NOTE — ED Triage Notes (Signed)
Pt arrives via EMS from home. Pt was bit by dog on lateral right leg. Extensive damage, skin completely removed from calf.  Bleeding controlled at this time  18LFA Fent 146/94 HR 97 RR 18

## 2021-01-25 NOTE — Anesthesia Postprocedure Evaluation (Signed)
Anesthesia Post Note  Patient: Travis [name]  Procedure(s) Performed: IRRIGATION AND DEBRIDEMENT LEG (Right ) APPLICATION OF WOUND VAC (Right Leg Lower)     Patient location during evaluation: PACU Anesthesia Type: General Level of consciousness: awake and alert Pain management: pain level controlled Vital Signs Assessment: post-procedure vital signs reviewed and stable Respiratory status: spontaneous breathing, nonlabored ventilation, respiratory function stable and patient connected to nasal cannula oxygen Cardiovascular status: blood pressure returned to baseline and stable Postop Assessment: no apparent nausea or vomiting Anesthetic complications: no   No complications documented.  Last Vitals:  Vitals:   01/25/21 1810 01/25/21 2033  BP: (!) 168/89 (!) 153/76  Pulse: 85 78  Resp: 16 18  Temp: 36.9 C (!) 36.4 C  SpO2: 99% 100%    Last Pain:  Vitals:   01/25/21 1856  TempSrc:   PainSc: 5                  Tevyn Codd P Aidel Davisson

## 2021-01-25 NOTE — Transfer of Care (Signed)
Immediate Anesthesia Transfer of Care Note  Patient: Travis [name]  Procedure(s) Performed: IRRIGATION AND DEBRIDEMENT LEG (Right ) APPLICATION OF WOUND VAC (Right Leg Lower)  Patient Location: PACU  Anesthesia Type:General  Level of Consciousness: awake, alert  and oriented  Airway & Oxygen Therapy: Patient Spontanous Breathing  Post-op Assessment: Report given to RN and Post -op Vital signs reviewed and stable  Post vital signs: Reviewed and stable  Last Vitals:  Vitals Value Taken Time  BP    Temp    Pulse    Resp 22 01/25/21 1634  SpO2    Vitals shown include unvalidated device data.  Last Pain:  Vitals:   01/25/21 1316  TempSrc:   PainSc: 6       Patients Stated Pain Goal: 2 (01/25/21 1316)  Complications: No complications documented.

## 2021-01-25 NOTE — ED Notes (Signed)
Wallet, socks, belt, and misc items placed in bag with label and placed in bed with patient.

## 2021-01-25 NOTE — ED Notes (Signed)
Clothes cut off upon arrival. Pt verbalized consent in doing so.

## 2021-01-25 NOTE — Progress Notes (Signed)
Pharmacy Antibiotic Note  Travis [name] is a 65 y.o. male admitted on 01/25/2021 with a dog bite on the right leg (deep wounds).  Pharmacy has been consulted for zosyn dosing. -plans for a total of 5 days antibiotics  Plan: -Zosyn 3.375gm IV q8h for a total duration of 5 days -Will follow patient progress   Height: 5\' 10"  (177.8 cm) Weight: 72.6 kg (160 lb) IBW/kg (Calculated) : 73  Temp (24hrs), Avg:98.4 F (36.9 C), Min:98.1 F (36.7 C), Max:98.6 F (37 C)  Recent Labs  Lab 01/25/21 1137  WBC 8.5  CREATININE 0.76    Estimated Creatinine Clearance: 94.5 mL/min (by C-G formula based on SCr of 0.76 mg/dL).    No Known Allergies   Thank you for allowing pharmacy to be a part of this patient's care.  03/27/21, PharmD Clinical Pharmacist **Pharmacist phone directory can now be found on [email or web address] ([email or web address]).  Listed under [facility].

## 2021-01-25 NOTE — Anesthesia Procedure Notes (Signed)
Procedure Name: LMA Insertion Date/Time: 01/25/2021 3:00 PM Performed by: Dairl Ponder, CRNA Pre-anesthesia Checklist: Patient identified, Emergency Drugs available, Suction available, Patient being monitored and Timeout performed Patient Re-evaluated:Patient Re-evaluated prior to induction Oxygen Delivery Method: Circle system utilized Preoxygenation: Pre-oxygenation with 100% oxygen Induction Type: IV induction LMA: LMA inserted LMA Size: 4.0 Number of attempts: 1 Placement Confirmation: positive ETCO2 and breath sounds checked- equal and bilateral Tube secured with: Tape Dental Injury: Teeth and Oropharynx as per pre-operative assessment

## 2021-01-26 ENCOUNTER — Encounter (HOSPITAL_COMMUNITY): Payer: Self-pay | Admitting: Orthopedic Surgery

## 2021-01-26 DIAGNOSIS — E440 Moderate protein-calorie malnutrition: Secondary | ICD-10-CM | POA: Insufficient documentation

## 2021-01-26 DIAGNOSIS — S81801A Unspecified open wound, right lower leg, initial encounter: Secondary | ICD-10-CM

## 2021-01-26 DIAGNOSIS — W540XXD Bitten by dog, subsequent encounter: Secondary | ICD-10-CM

## 2021-01-26 LAB — BASIC METABOLIC PANEL
Anion gap: 10 (ref 5–15)
BUN: 11 mg/dL (ref 8–23)
CO2: 24 mmol/L (ref 22–32)
Calcium: 8.5 mg/dL — ABNORMAL LOW (ref 8.9–10.3)
Chloride: 101 mmol/L (ref 98–111)
Creatinine, Ser: 0.85 mg/dL (ref 0.61–1.24)
GFR, Estimated: >60 mL/min (ref >60)
Glucose, Bld: 134 mg/dL — ABNORMAL HIGH (ref 70–99)
Potassium: 3.8 mmol/L (ref 3.5–5.1)
Sodium: 135 mmol/L (ref 135–145)

## 2021-01-26 LAB — CBC
HCT: 42.7 % (ref 39.0–52.0)
Hemoglobin: 13.9 g/dL (ref 13.0–17.0)
MCH: 24.4 pg — ABNORMAL LOW (ref 26.0–34.0)
MCHC: 32.6 g/dL (ref 30.0–36.0)
MCV: 75 fL — ABNORMAL LOW (ref 80.0–100.0)
Platelets: 186 10*3/uL (ref 150–400)
RBC: 5.69 MIL/uL (ref 4.22–5.81)
RDW: 16.6 % — ABNORMAL HIGH (ref 11.5–15.5)
WBC: 10.7 10*3/uL — ABNORMAL HIGH (ref 4.0–10.5)
nRBC: 0 % (ref 0.0–0.2)

## 2021-01-26 MED ORDER — PROSOURCE PLUS PO LIQD
30.0000 mL | Freq: Two times a day (BID) | ORAL | Status: DC
  Administered 2021-01-27 – 2021-02-06 (×17): 30 mL via ORAL
  Filled 2021-01-26 (×17): qty 30

## 2021-01-26 MED ORDER — ENSURE ENLIVE PO LIQD
237.0000 mL | Freq: Two times a day (BID) | ORAL | Status: DC
  Administered 2021-01-27 – 2021-02-04 (×16): 237 mL via ORAL

## 2021-01-26 MED ORDER — JUVEN PO PACK
1.0000 | PACK | Freq: Two times a day (BID) | ORAL | Status: DC
  Administered 2021-01-27 – 2021-02-06 (×17): 1 via ORAL
  Filled 2021-01-26 (×17): qty 1

## 2021-01-26 MED ORDER — ENOXAPARIN SODIUM 40 MG/0.4ML IJ SOSY
40.0000 mg | PREFILLED_SYRINGE | INTRAMUSCULAR | Status: DC
  Administered 2021-01-26 – 2021-02-04 (×9): 40 mg via SUBCUTANEOUS
  Filled 2021-01-26 (×9): qty 0.4

## 2021-01-26 NOTE — Progress Notes (Signed)
Orthopaedic Trauma Service Progress Note  Patient ID: Travis [name] MRN: [id number] DOB/AGE: April 03, 1956 65 y.o.  Subjective:  Doing well  No complaints Leg sore but not too bad   Has been seen by plastics: plan for OR Friday   tox screen positive for cocaine  + smoker   States he did not know the dog that bit him. It actually jumped the fence while still on a chain   ROS As above  Objective:   VITALS:   Vitals:   01/25/21 1810 01/25/21 2033 01/26/21 0030 01/26/21 0514  BP: (!) 168/89 (!) 153/76 139/85 138/71  Pulse: 85 78 (!) 56 60  Resp: 16 18 17 17   Temp: 98.4 F (36.9 C) (!) 97.5 F (36.4 C) (!) 97.5 F (36.4 C) 98.2 F (36.8 C)  TempSrc: Oral     SpO2: 99% 100% 99% 100%  Weight:      Height:        Estimated body mass index is 22.96 kg/m as calculated from the following:   Height as of this encounter: 5\' 10"  (1.778 m).   Weight as of this encounter: 72.6 kg.   Intake/Output      06/05 0701 06/06 0700 06/06 0701 06/07 0700   P.O. 120 360   I.V. (mL/kg) 800 (11)    IV Piggyback 284.9    Total Intake(mL/kg) 1204.9 (16.6) 360 (5)   Urine (mL/kg/hr) 1600 250 (1.1)   Drains 25    Blood 15    Total Output 1640 250   Net -435.1 +110          LABS  Results for orders placed or performed during the hospital encounter of 01/25/21 (from the past 24 hour(s))  CBC with Differential     Status: Abnormal   Collection Time: 01/25/21 11:37 AM  Result Value Ref Range   WBC 8.5 4.0 - 10.5 K/uL   RBC 6.07 (H) 4.22 - 5.81 MIL/uL   Hemoglobin 15.0 13.0 - 17.0 g/dL   HCT 03/27/21 03/27/21 - 09.4 %   MCV 75.8 (L) 80.0 - 100.0 fL   MCH 24.7 (L) 26.0 - 34.0 pg   MCHC 32.6 30.0 - 36.0 g/dL   RDW 70.9 (H) 62.8 - 36.6 %   Platelets 161 150 - 400 K/uL   nRBC 0.0 0.0 - 0.2 %   Neutrophils Relative % 49 %   Neutro Abs 4.1 1.7 - 7.7 K/uL   Lymphocytes Relative 42 %   Lymphs Abs 3.6 0.7 - 4.0 K/uL    Monocytes Relative 8 %   Monocytes Absolute 0.7 0.1 - 1.0 K/uL   Eosinophils Relative 0 %   Eosinophils Absolute 0.0 0.0 - 0.5 K/uL   Basophils Relative 1 %   Basophils Absolute 0.0 0.0 - 0.1 K/uL   Immature Granulocytes 0 %   Abs Immature Granulocytes 0.01 0.00 - 0.07 K/uL  Basic metabolic panel     Status: Abnormal   Collection Time: 01/25/21 11:37 AM  Result Value Ref Range   Sodium 135 135 - 145 mmol/L   Potassium 4.0 3.5 - 5.1 mmol/L   Chloride 102 98 - 111 mmol/L   CO2 20 (L) 22 - 32 mmol/L   Glucose, Bld 94 70 - 99 mg/dL   BUN 8 8 - 23 mg/dL   Creatinine, Ser  0.76 0.61 - 1.24 mg/dL   Calcium 8.7 (L) 8.9 - 10.3 mg/dL   GFR, Estimated >61 >60 mL/min   Anion gap 13 5 - 15  Protime-INR     Status: None   Collection Time: 01/25/21 11:37 AM  Result Value Ref Range   Prothrombin Time 13.1 11.4 - 15.2 seconds   INR 1.0 0.8 - 1.2  Resp Panel by RT-PCR (Flu A&B, Covid) Nasopharyngeal Swab     Status: None   Collection Time: 01/25/21 11:41 AM   Specimen: Nasopharyngeal Swab; Nasopharyngeal(NP) swabs in vial transport medium  Result Value Ref Range   SARS Coronavirus 2 by RT PCR NEGATIVE NEGATIVE   Influenza A by PCR NEGATIVE NEGATIVE   Influenza B by PCR NEGATIVE NEGATIVE  Type and screen [facility]     Status: None   Collection Time: 01/25/21 11:44 AM  Result Value Ref Range   ABO/RH(D) B POS    Antibody Screen NEG    Sample Expiration      01/28/2021,2359 Performed at [facility], [address], [place], [place] [address]   HIV Antibody (routine testing w rflx)     Status: None   Collection Time: 01/25/21  6:24 PM  Result Value Ref Range   HIV Screen 4th Generation wRfx Non Reactive Non Reactive  Surgical PCR screen     Status: None   Collection Time: 01/25/21  7:38 PM   Specimen: Nasal Mucosa; Nasal Swab  Result Value Ref Range   MRSA, PCR NEGATIVE NEGATIVE   Staphylococcus aureus NEGATIVE NEGATIVE  Urine rapid drug screen (hosp performed)      Status: Abnormal   Collection Time: 01/25/21 10:34 PM  Result Value Ref Range   Opiates NONE DETECTED NONE DETECTED   Cocaine POSITIVE (A) NONE DETECTED   Benzodiazepines POSITIVE (A) NONE DETECTED   Amphetamines NONE DETECTED NONE DETECTED   Tetrahydrocannabinol NONE DETECTED NONE DETECTED   Barbiturates NONE DETECTED NONE DETECTED  CBC     Status: Abnormal   Collection Time: 01/26/21 12:41 AM  Result Value Ref Range   WBC 10.7 (H) 4.0 - 10.5 K/uL   RBC 5.69 4.22 - 5.81 MIL/uL   Hemoglobin 13.9 13.0 - 17.0 g/dL   HCT 62.6 94.8 - 54.6 %   MCV 75.0 (L) 80.0 - 100.0 fL   MCH 24.4 (L) 26.0 - 34.0 pg   MCHC 32.6 30.0 - 36.0 g/dL   RDW 27.0 (H) 35.0 - 09.3 %   Platelets 186 150 - 400 K/uL   nRBC 0.0 0.0 - 0.2 %  Basic metabolic panel     Status: Abnormal   Collection Time: 01/26/21 12:41 AM  Result Value Ref Range   Sodium 135 135 - 145 mmol/L   Potassium 3.8 3.5 - 5.1 mmol/L   Chloride 101 98 - 111 mmol/L   CO2 24 22 - 32 mmol/L   Glucose, Bld 134 (H) 70 - 99 mg/dL   BUN 11 8 - 23 mg/dL   Creatinine, Ser 8.18 0.61 - 1.24 mg/dL   Calcium 8.5 (L) 8.9 - 10.3 mg/dL   GFR, Estimated >29 >93 mL/min   Anion gap 10 5 - 15     PHYSICAL EXAM:   Gen: awake, alert, sitting up in bed, NAD Lungs: unlabored Ext:       Right Lower Extremity   Dressing c/d/i  Ext warm   DPN and TN sensation intact  No SPN noted  EHL, FHL, lesser toe motor  intact  Ankle flexion, extension, inversion and eversion grossly intact  No pain out of proportion with passive stretch   VAC functioning, good seal, minimal serosanguinous drainage   Assessment/Plan: 1 Day Post-Op   Principal Problem:   Dog bite Active Problems:   Accelerated hypertension   Chronic systolic CHF (congestive heart failure) (HCC)   Alcohol dependence (HCC)   Tobacco dependence   Noncompliance with medication regimen   Anti-infectives (From admission, onward)   Start     Dose/Rate Route Frequency Ordered Stop    01/26/21 0100  piperacillin-tazobactam (ZOSYN) IVPB 3.375 g        3.375 g 12.5 mL/hr over 240 Minutes Intravenous Every 8 hours 01/25/21 1832 01/31/21 0059   01/25/21 1930  piperacillin-tazobactam (ZOSYN) IVPB 3.375 g        3.375 g 100 mL/hr over 30 Minutes Intravenous  Once 01/25/21 1832 01/25/21 2053   01/25/21 1130  Ampicillin-Sulbactam (UNASYN) 3 g in sodium chloride 0.9 % 100 mL IVPB        3 g 200 mL/hr over 30 Minutes Intravenous  Once 01/25/21 1130 01/25/21 1325    .  POD/HD#: 11  65 y/o male with complex soft tissue injury R lower leg related to dog bite  -dog bite  - complex soft tissue injury R lower leg s/p I&D, placement of wound vac  Further  Management per plastics  Leave vac on until pt returns to OR  Recommend NWB R leg to protect soft tissue envelope  Night splint ordered   Elevate for swelling control   - Pain management:  Multimodal   - ABL anemia/Hemodynamics  Stable  - Medical issues   Per primary     Polysubstance abuse   - DVT/PE prophylaxis:  Ok to start lovenox for dvt prophylaxis   - ID:   Zosyn x 5 days for dog bite prophylaxis    - Dispo:  Return to OR with plastics on Friday   Please call with questions related to [facility]    [name], PA-C [phone] (C) 01/26/2021, 10:14 AM  [facility] [address] [place] [address] [phone] Val [phone] (F)    After 5pm and on the weekends please log on to Amion, go to orthopaedics and the look under the [facility] Call for the provider(s) on call. You can also call our office at [phone] and then follow the prompts to be connected to the call team.

## 2021-01-26 NOTE — Progress Notes (Signed)
Orthopedic Tech Progress Note Patient Details:  Travis [name] [birth year] [id number]  Patient ID: Travis [name], male   DOB: 1956/03/21, 65 y.o.   MRN: [id number] Called order into hanger  Trinna Post 01/26/2021, 6:28 AM

## 2021-01-26 NOTE — Progress Notes (Signed)
Orthopedic Tech Progress Note Patient Details:  Travis [name] 1955-12-20 [id number]  Patient ID: Travis [name], male   DOB: 07-09-1956, 65 y.o.   MRN: [id number] Applied ohf  Trinna Post 01/26/2021, 5:56 AM

## 2021-01-26 NOTE — Progress Notes (Signed)
Initial Nutrition Assessment  DOCUMENTATION CODES:  Non-severe (moderate) malnutrition in context of chronic illness  INTERVENTION:  Add Ensure Enlive po BID, each supplement provides 350 kcal and 20 grams of protein.  Add 30 ml ProSource Plus po BID, each supplement provides 100 kcal and 15 grams of protein.   Add 1 packet Juven BID, each packet provides 95 calories, 2.5 grams of protein (collagen), and 9.8 grams of carbohydrate (3 grams sugar); also contains 7 grams of L-arginine and L-glutamine, 300 mg vitamin C, 15 mg vitamin E, 1.2 mcg vitamin B-12, 9.5 mg zinc, 200 mg calcium, and 1.5 g  Calcium Beta-hydroxy-Beta-methylbutyrate to support wound healing  Continue CIWA protocol.  NUTRITION DIAGNOSIS:  Moderate Malnutrition related to chronic illness as evidenced by mild fat depletion,mild muscle depletion.  GOAL:  Patient will meet greater than or equal to 90% of their needs   MONITOR:  PO intake,Supplement acceptance,Labs,Weight trends,Skin,I & O's  REASON FOR ASSESSMENT:  Consult Assessment of nutrition requirement/status  ASSESSMENT:  65 yo male with a PMH of HTN; ETOH dependence; chronic systolic CHF; and remote h/o ICH presenting with a severe dog bite wound. 6/5 - I&D R leg, wound vac applied 6/10 - tentative revision sx  Spoke with pt at bedside. Pt reports eating well at home PTA with no changes in appetite or eating habits lately. Per Epic, pt ate 100% of breakfast this morning. RD observed pt ate 100% of lunch tray.  Pt denies any weight changes as well. Per Epic, pt does not have recent weight history, but pt's weight has increased since 08/2018. However, weight is exact in documentation, so weight is likely stated.  On exam, pt with some mild depletions throughout body.  Given above information, pt moderately malnourished.  Pt needs increased protein and calories for healing. Recommend adding Ensure Enlive BID, ProSource Plus BID, and Juven BID to promote wound  healing. Also recommend continuing CIWA protocol.  Medications: reviewed; colace BID, folic acid, Ativan, MVI with minerals, thiamine, Zosyn, Vicodin PRN (given once today)  Labs: reviewed; Glucose 134, serum Ca 8.5  NUTRITION - FOCUSED PHYSICAL EXAM: Flowsheet Row Most Recent Value  Orbital Region Mild depletion  Upper Arm Region No depletion  Thoracic and Lumbar Region No depletion  Buccal Region Mild depletion  Temple Region Mild depletion  Clavicle Bone Region Mild depletion  Clavicle and Acromion Bone Region No depletion  Scapular Bone Region Mild depletion  Dorsal Hand Mild depletion  Patellar Region Mild depletion  Anterior Thigh Region Mild depletion  Posterior Calf Region No depletion  Edema (RD Assessment) None  Hair Reviewed  Eyes Reviewed  Mouth Reviewed  Skin Reviewed  Nails Reviewed     Diet Order:   Diet Order            Diet Heart Room service appropriate? Yes; Fluid consistency: Thin  Diet effective now                EDUCATION NEEDS:  Education needs have been addressed  Skin:  Skin Assessment: Skin Integrity Issues: Skin Integrity Issues:: Incisions Incisions: R leg, closed  Last BM:  01/24/21  Height:  Ht Readings from Last 1 Encounters:  01/25/21 5\' 10"  (1.778 m)   Weight:  Wt Readings from Last 1 Encounters:  01/25/21 72.6 kg   Ideal Body Weight:  75.5 kg  BMI:  Body mass index is 22.96 kg/m.  Estimated Nutritional Needs:  Kcal:  2000-2200 Protein:  105-120 grams Fluid:  >2 L  03/27/21,  RD, LDN Registered Dietitian I After-Hours/Weekend Pager # in [place]
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
## 2021-01-26 NOTE — Progress Notes (Signed)
Occupational Therapy Evaluation Patient Details Name: Travis [name] MRN: [id number] DOB: August 13, 1956 Today's Date: 01/26/2021    History of Present Illness Travis [name] is a 65 y.o. male admitted on 01/25/2021 with a dog bite on the right leg (deep wounds); 6/5 IRRIGATION AND DEBRIDEMENT R leg, wound vac applied.  He reports that he was at someone's house and was walking through the back yard when a neighbor's pit bull jumped over the fence and mauled him. Pt with medical history significant of HTN; ETOH dependence; chronic systolic CHF. He has not had any f/u since his last hospitalization for CHF and does not take medications. Tentative plans for revision sx 6/10.   Clinical Impression   Travis [name] was evaluated s/p the above dog bite. PTA pt was indep in all ADL/IADLs including driving, he lives in a 1 level home with 1 STE with his girlfriend. Upon evaluation pt was supervision for bed mobility, min guard for transfers with rw and min guard/min A for ADLs. Pt maintained RLE NWB well with no report of pain with hopping with rw. Pt benefits from continued OT acutely to progress indep in all ADLs and mobility. Recommend d/c to home with supervision initially for all transfers and ADLs.     Follow Up Recommendations  No OT follow up;Supervision - Intermittent    Equipment Recommendations  Other (comment) (RW)       Precautions / Restrictions Precautions Precautions: Fall Restrictions Weight Bearing Restrictions: Yes RLE Weight Bearing: Non weight bearing      Mobility Bed Mobility Overal bed mobility: Needs Assistance Bed Mobility: Supine to Sit;Sit to Supine     Supine to sit: Supervision Sit to supine: Supervision        Transfers Overall transfer level: Needs assistance Equipment used: Rolling walker (2 wheeled) Transfers: Sit to/from [employer] Sit to Stand: Min guard;From elevated surface Stand pivot transfers: Min guard       General transfer comment: vc  for safety    Balance Overall balance assessment: Needs assistance Sitting-balance support: No upper extremity supported Sitting balance-Leahy Scale: Good     Standing balance support: Bilateral upper extremity supported Standing balance-Leahy Scale: Poor             ADL either performed or assessed with clinical judgement   ADL Overall ADL's : Needs assistance/impaired Eating/Feeding: Independent;Sitting   Grooming: Set up;Sitting   Upper Body Bathing: Set up;Sitting   Lower Body Bathing: Minimal assistance;Sit to/from stand   Upper Body Dressing : Set up;Sitting   Lower Body Dressing: Moderate assistance;Sit to/from stand   Toilet Transfer: Min guard;RW;Cueing for safety;Cueing for sequencing;Stand-pivot;BSC   Toileting- Architect and Hygiene: Min guard;Sitting/lateral lean       Functional mobility during ADLs: Min guard;Rolling walker;Cueing for safety;Cueing for sequencing       Vision Baseline Vision/History: Wears glasses Wears Glasses: At all times Vision Assessment?: No apparent visual deficits            Pertinent Vitals/Pain Pain Assessment: Faces Faces Pain Scale: Hurts a little bit Pain Location: RLE Pain Descriptors / Indicators: Discomfort Pain Intervention(s): Monitored during session     Hand Dominance     Extremity/Trunk Assessment Upper Extremity Assessment Upper Extremity Assessment: Overall WFL for tasks assessed   Lower Extremity Assessment Lower Extremity Assessment: RLE deficits/detail RLE Deficits / Details: s/p dog bite   Cervical / Trunk Assessment Cervical / Trunk Assessment: Normal   Communication Communication Communication: No difficulties   Cognition Arousal/Alertness: Awake/alert Behavior During  Therapy: WFL for tasks assessed/performed Overall Cognitive Status: Within Functional Limits for tasks assessed        General Comments: Pt with safety awarenesss concerns and limited insight to  deficits   General Comments  wound vac intact, no new concerns noted; pt reported that he has been getting up on his own, RN notified      Home Living Family/patient expects to be discharged to:: Private residence Living Arrangements: Spouse/significant other Available Help at Discharge: Family;Available 24 hours/day Type of Home: House Home Access: Stairs to enter [employer] of Steps: 1   Home Layout: One level     Bathroom Shower/Tub: IT trainer: Standard Bathroom Accessibility: Yes How Accessible: Accessible via wheelchair;Accessible via walker Home Equipment: None   Additional Comments: pt's girlfriend has L sided weakness      Prior Functioning/Environment Level of Independence: Independent        Comments: Drives, does not work        OT Problem List: Decreased strength;Decreased range of motion;Decreased activity tolerance;Impaired balance (sitting and/or standing);Decreased knowledge of use of DME or AE;Decreased knowledge of precautions;Decreased safety awareness;Pain      OT Treatment/Interventions: Self-care/ADL training;Therapeutic exercise;DME and/or AE instruction;Patient/family education;Balance training    OT Goals(Current goals can be found in the care plan section) Acute Rehab OT Goals Patient Stated Goal: home soon OT Goal Formulation: With patient Time For Goal Achievement: 02/09/21 Potential to Achieve Goals: Good ADL Goals Pt Will Perform Grooming: with supervision;standing Pt Will Perform Lower Body Bathing: with supervision;sit to/from stand Pt Will Perform Lower Body Dressing: with supervision;sit to/from stand Pt Will Transfer to Toilet: with supervision;ambulating;regular height toilet Pt Will Perform Toileting - Clothing Manipulation and hygiene: with modified independence;sit to/from stand  OT Frequency: Min 2X/week    AM-PAC OT "6 Clicks" Daily Activity     Outcome Measure Help from  another person eating meals?: None Help from another person taking care of personal grooming?: A Little Help from another person toileting, which includes using toliet, bedpan, or urinal?: A Little Help from another person bathing (including washing, rinsing, drying)?: A Little Help from another person to put on and taking off regular upper body clothing?: A Little Help from another person to put on and taking off regular lower body clothing?: A Lot 6 Click Score: 18   End of Session Equipment Utilized During Treatment: Gait belt;Rolling walker Nurse Communication: Mobility status;Precautions;Weight bearing status;Other (comment) (safety concerns)  Activity Tolerance: Patient tolerated treatment well Patient left: in bed;with family/visitor present  OT Visit Diagnosis: Other abnormalities of gait and mobility (R26.89);Pain Pain - Right/Left: Right Pain - part of body: Leg                Time: 1025-8527 OT Time Calculation (min): 17 min Charges:  OT General Charges $OT Visit: 1 Visit OT Evaluation $OT Eval Moderate Complexity: 1 Mod   Gussie Murton A Taelor Waymire 01/26/2021, 2:19 PM
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
## 2021-01-26 NOTE — Progress Notes (Signed)
TRN at bedside to ask cage aid questions. Pt stated he needed to use the bedside commode. TRN helped pt to the commode and then back into bed when he was completed. Pt noted to have urinated and had a small BM. Primary RN notified that TRN assisted with pt to bedside commode and that pt is now back in bed. Call light within reach. Bed in low position.

## 2021-01-26 NOTE — Progress Notes (Signed)
Orthopedic Tech Progress Note Patient Details:  Travis [name] July 02, 1956 [id number] HANGER called needing a little more information for patient order. Patient ID: Travis [name], male   DOB: 07-02-1956, 65 y.o.   MRN: [id number]   Travis [name] 01/26/2021, 8:07 AM

## 2021-01-26 NOTE — TOC CAGE-AID Note (Signed)
Transition of Care [facility]) - CAGE-AID Screening   Patient Details  Name: Travis [name] MRN: [id number] Date of Birth: May 25, 1956   Clinical Narrative:  Pt denies drug and alcohol use.   CAGE-AID Screening:    Have You Ever Felt You Ought to Cut Down on Your Drinking or Drug Use?: No Have People Annoyed You By Critizing Your Drinking Or Drug Use?: No Have You Felt Bad Or Guilty About Your Drinking Or Drug Use?: No Have You Ever Had a Drink or Used Drugs First Thing In The Morning to Steady Your Nerves or to Get Rid of a Hangover?: No CAGE-AID Score: 0  Substance Abuse Education Offered: No

## 2021-01-27 LAB — BASIC METABOLIC PANEL
Anion gap: 6 (ref 5–15)
BUN: 10 mg/dL (ref 8–23)
CO2: 30 mmol/L (ref 22–32)
Calcium: 8.6 mg/dL — ABNORMAL LOW (ref 8.9–10.3)
Chloride: 100 mmol/L (ref 98–111)
Creatinine, Ser: 0.76 mg/dL (ref 0.61–1.24)
GFR, Estimated: >60 mL/min (ref >60)
Glucose, Bld: 98 mg/dL (ref 70–99)
Potassium: 3.9 mmol/L (ref 3.5–5.1)
Sodium: 136 mmol/L (ref 135–145)

## 2021-01-27 LAB — CBC
HCT: 38.9 % — ABNORMAL LOW (ref 39.0–52.0)
Hemoglobin: 12.6 g/dL — ABNORMAL LOW (ref 13.0–17.0)
MCH: 24.1 pg — ABNORMAL LOW (ref 26.0–34.0)
MCHC: 32.4 g/dL (ref 30.0–36.0)
MCV: 74.4 fL — ABNORMAL LOW (ref 80.0–100.0)
Platelets: 205 10*3/uL (ref 150–400)
RBC: 5.23 MIL/uL (ref 4.22–5.81)
RDW: 15.9 % — ABNORMAL HIGH (ref 11.5–15.5)
WBC: 9.5 10*3/uL (ref 4.0–10.5)
nRBC: 0 % (ref 0.0–0.2)
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
## 2021-01-27 NOTE — Progress Notes (Signed)
Physical Therapy Treatment Patient Details Name: Travis [name] MRN: [id number] DOB: [birth year] Today's Date: 01/27/2021    History of Present Illness Viet Kemmerer is a 65 y.o. male admitted on 01/25/2021 with a dog bite on the right leg (deep wounds); 6/5 IRRIGATION AND DEBRIDEMENT R leg, wound vac applied.  He reports that he was at someone's house and was walking through the back yard when a neighbor's pit bull jumped over the fence and mauled him. Pt with medical history significant of HTN; ETOH dependence; chronic systolic CHF. He has not had any f/u since his last hospitalization for CHF and does not take medications. Tentative plans for revision sx 6/10.    PT Comments    Pt making excellent progress with mobility. Does well maintaining his weight bearing status. Should be able to return home from PT standpoint when medical issues allow.    Follow Up Recommendations  No PT follow up;Supervision - Intermittent (pending surgery on friday, may need outpt PT for R foot)     Equipment Recommendations  Rolling walker with 5" wheels    Recommendations for Other Services       Precautions / Restrictions Precautions Precautions: Other (comment) Precaution Comments: wound VAC Restrictions Weight Bearing Restrictions: Yes RLE Weight Bearing: Non weight bearing    Mobility  Bed Mobility               General bed mobility comments: Pt up in chair    Transfers Overall transfer level: Needs assistance Equipment used: Rolling walker (2 wheeled) Transfers: Sit to/from Stand Sit to Stand: Supervision         General transfer comment: Assist for safety. Good hand placement without cues  Ambulation/Gait Ambulation/Gait assistance: Supervision Gait Distance (Feet): 150 Feet Assistive device: Rolling walker (2 wheeled) Gait Pattern/deviations: Step-to pattern (hop to) Gait velocity: decr Gait velocity interpretation: <1.31 ft/sec, indicative of household ambulator General  Gait Details: Assist for safety and lines   Stairs             Wheelchair Mobility    Modified Rankin (Stroke Patients Only)       Balance Overall balance assessment: Needs assistance Sitting-balance support: No upper extremity supported Sitting balance-Leahy Scale: Good     Standing balance support: Bilateral upper extremity supported Standing balance-Leahy Scale: Poor Standing balance comment: dependent on RW due to R LE NWB                            Cognition Arousal/Alertness: Awake/alert Behavior During Therapy: WFL for tasks assessed/performed Overall Cognitive Status: Within Functional Limits for tasks assessed                                        Exercises      General Comments        Pertinent Vitals/Pain Pain Assessment: Faces Faces Pain Scale: Hurts little more Pain Location: RLE Pain Descriptors / Indicators: Grimacing;Guarding Pain Intervention(s): Limited activity within patient's tolerance;Monitored during session;Repositioned    Home Living                      Prior Function            PT Goals (current goals can now be found in the care plan section) Acute Rehab PT Goals Patient Stated Goal: home soon Progress towards PT goals: Progressing  toward goals    Frequency    Min 3X/week      PT Plan Current plan remains appropriate    Co-evaluation              AM-PAC PT "6 Clicks" Mobility   Outcome Measure  Help needed turning from your back to your side while in a flat bed without using bedrails?: None Help needed moving from lying on your back to sitting on the side of a flat bed without using bedrails?: None Help needed moving to and from a bed to a chair (including a wheelchair)?: A Little Help needed standing up from a chair using your arms (e.g., wheelchair or bedside chair)?: A Little Help needed to walk in hospital room?: A Little Help needed climbing 3-5 steps with a  railing? : A Little 6 Click Score: 20    End of Session   Activity Tolerance: Patient tolerated treatment well Patient left: in chair;with call bell/phone within reach   PT Visit Diagnosis: Unsteadiness on feet (R26.81);Muscle weakness (generalized) (M62.81);Difficulty in walking, not elsewhere classified (R26.2)     Time: 8563-1497 PT Time Calculation (min) (ACUTE ONLY): 12 min  Charges:  $Gait Training: 8-22 mins                     [facility] PT Acute Rehabilitation Services Pager [phone] Office [phone]    Angelina Ok [facility] 01/27/2021, 12:21 PM
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
## 2021-01-28 DIAGNOSIS — E440 Moderate protein-calorie malnutrition: Secondary | ICD-10-CM

## 2021-01-28 MED ORDER — RABIES VACCINE, PCEC IM SUSR
1.0000 mL | Freq: Once | INTRAMUSCULAR | Status: AC
  Administered 2021-01-29: 1 mL via INTRAMUSCULAR
  Filled 2021-01-28 (×2): qty 1

## 2021-01-28 MED ORDER — RABIES IMMUNE GLOBULIN 150 UNIT/ML IM INJ
20.0000 [IU]/kg | INJECTION | Freq: Once | INTRAMUSCULAR | Status: AC
  Administered 2021-01-29: 1425 [IU] via INTRAMUSCULAR
  Filled 2021-01-28 (×2): qty 10

## 2021-01-28 NOTE — Progress Notes (Signed)
Occupational Therapy Treatment Patient Details Name: Travis [name] MRN: [id number] DOB: November 19, 1955 Today's Date: 01/28/2021    History of present illness Travis [name] is a 65 y.o. male admitted on 01/25/2021 with a dog bite on the right leg (deep wounds); 6/5 IRRIGATION AND DEBRIDEMENT R leg, wound vac applied.  He reports that he was at someone's house and was walking through the back yard when a neighbor's pit bull jumped over the fence and mauled him. Pt with medical history significant of HTN; ETOH dependence; chronic systolic CHF. He has not had any f/u since his last hospitalization for CHF and does not take medications. Tentative plans for revision sx 6/10.   OT comments  This 65 yo male admitted and underwent above seen today to focus on toilet transfers, LB ADLs, and to talk about tub transfers. Pt over is at a Mod I level with RW (just needs A with IV and wound vac), except does do better with minimal A for RLE ADLs--he can manage but it is difficult. He has girlfriend at home that can A prn. Goals updated. We will continue to follow.  Follow Up Recommendations  No OT follow up;Supervision - Intermittent    Equipment Recommendations  3 in 1 bedside commode;Tub/shower bench       Precautions / Restrictions Precautions Precautions: Other (comment) Precaution Comments: wound VAC Required Braces or Orthoses: Other Brace Other Brace: resting boot at night for RLE Restrictions Weight Bearing Restrictions: Yes RLE Weight Bearing: Non weight bearing       Mobility Bed Mobility               General bed mobility comments: Pt sitting on EOB upon arrival    Transfers Overall transfer level: Modified independent Equipment used: Rolling walker (2 wheeled) Transfers: Sit to/from Stand Sit to Stand: Modified independent (Device/Increase time)         General transfer comment: Mod I to ambulate into bathroom with RW --only needs management of IV line and wound vac     Balance Overall balance assessment: Modified Independent Sitting-balance support: No upper extremity supported       Standing balance support: No upper extremity supported Standing balance-Leahy Scale: Fair Standing balance comment: standing at sink to wash hands                           ADL either performed or assessed with clinical judgement   ADL Overall ADL's : Needs assistance/impaired     Grooming: Wash/dry hands;Modified independent;Standing Grooming Details (indicate cue type and reason): at sink               Lower Body Dressing Details (indicate cue type and reason): We discussed that he when he gets dressed to put RLE in underwear/pants first. He can donn a sock on RLE with increased effort/time (reports his girlfriend can help him), can donn LLE sock independently Toilet Transfer: Modified Independent;Ambulation;RW;Grab bars;Comfort height toilet Toilet Transfer Details (indicate cue type and reason): Only needed A for wound vac and IV management, maintained NWB'ing RLE Toileting- Clothing Manipulation and Hygiene: Modified independent;Sit to/from stand     Tub/Shower Transfer Details (indicate cue type and reason): Showed pt a picture of a tub bench and explained how it works. Recommended one for him, making it very clear that he could not get leg wet until surgeon cleared him to do so.         Vision Patient Visual Report: No change  from baseline            Cognition Arousal/Alertness: Awake/alert Behavior During Therapy: WFL for tasks assessed/performed Overall Cognitive Status: Within Functional Limits for tasks assessed                                          Exercises Other Exercises Other Exercises: Educated on knee presses and dorsiflexion to work on decreasing heel cord tightness.           Pertinent Vitals/ Pain       Pain Assessment: 0-10 Pain Score: 3  Pain Location: RLE Pain Descriptors / Indicators:  Aching;Sore Pain Intervention(s): Limited activity within patient's tolerance;Monitored during session;Repositioned         Frequency  Min 2X/week        Progress Toward Goals  OT Goals(current goals can now be found in the care plan section)   Goals updated  Acute Rehab OT Goals Patient Stated Goal: hopes to go home Friday after surgery OT Goal Formulation: With patient Time For Goal Achievement: 02/09/21 Potential to Achieve Goals: Good         AM-PAC OT "6 Clicks" Daily Activity     Outcome Measure   Help from another person eating meals?: None Help from another person taking care of personal grooming?: None Help from another person toileting, which includes using toliet, bedpan, or urinal?: None Help from another person bathing (including washing, rinsing, drying)?: A Little Help from another person to put on and taking off regular upper body clothing?: None Help from another person to put on and taking off regular lower body clothing?: A Little 6 Click Score: 22    End of Session Equipment Utilized During Treatment: Rolling walker  OT Visit Diagnosis: Other abnormalities of gait and mobility (R26.89);Pain Pain - Right/Left: Right Pain - part of body: Leg   Activity Tolerance Patient tolerated treatment well   Patient Left in chair;with call bell/phone within reach   Nurse Communication  (chat text to RN about his resting foot boot to be worn at night on RLE (per orders), in room.)        Time: 9381-8299 OT Time Calculation (min): 25 min  Charges: OT General Charges $OT Visit: 1 Visit OT Treatments $Self Care/Home Management : 23-37 mins  Ignacia Palma, OTR/L Acute Rehab Services Pager [phone] Office [phone]      Evette Georges 01/28/2021, 9:53 AM

## 2021-01-28 NOTE — Care Management Important Message (Signed)
Important Message  Patient Details  Name: Travis [name] MRN: [id number] Date of Birth: [birth year]   Medicare Important Message Given:  Yes     Julizza Sassone Stefan Church 01/28/2021, 1:34 PM

## 2021-01-28 NOTE — Progress Notes (Signed)
Orthopaedic Trauma Service Progress Note  Patient ID: Travis [name] MRN: [id number] DOB/AGE: 10/24/1955 65 y.o.  Subjective:  No acute issues  Sitting up in chair   Plastic surgery service has had difficulty getting the patient on the schedule for Friday  We will take back to the OR tomorrow for repeat irrigation debridement of his wounds and VAC change  ROS As above  Objective:   VITALS:   Vitals:   01/27/21 0403 01/27/21 1250 01/27/21 1944 01/28/21 0710  BP: 132/72 130/80 130/67 (!) 159/80  Pulse: 70 65 69 (!) 55  Resp: 18 14 20 20   Temp: 98.3 F (36.8 C) 97.8 F (36.6 C) 98.5 F (36.9 C) 97.9 F (36.6 C)  TempSrc: Oral Oral Oral Oral  SpO2: 100% 100% 99% 99%  Weight:      Height:        Estimated body mass index is 22.96 kg/m as calculated from the following:   Height as of this encounter: 5\' 10"  (1.778 m).   Weight as of this encounter: 72.6 kg.   Intake/Output      06/07 0701 06/08 0700 06/08 0701 06/09 0700   P.O. 1057 300   IV Piggyback     Total Intake(mL/kg) 1057 (14.6) 300 (4.1)   Urine (mL/kg/hr) 2350 (1.3) 700 (3.1)   Drains     Stool 1 0   Total Output 2351 700   Net -1294 -400        Stool Occurrence 3 x 1 x     LABS  No results found for this or any previous visit (from the past 24 hour(s)).   PHYSICAL EXAM:   Gen: awake, alert, sitting up in bed, NAD Lungs: unlabored Ext:       Right Lower Extremity              Dressing c/d/i             Ext warm              DPN and TN sensation intact             No SPN noted             EHL, FHL, lesser toe motor intact             Ankle flexion, extension, inversion and eversion grossly intact             No pain out of proportion with passive stretch              VAC functioning, good seal, mild serosanguinous drainage   Assessment/Plan: 3 Days Post-Op   Principal Problem:   Dog bite Active Problems:    Accelerated hypertension   Chronic systolic CHF (congestive heart failure) (HCC)   Alcohol dependence (HCC)   Tobacco dependence   Noncompliance with medication regimen   Malnutrition of moderate degree   Anti-infectives (From admission, onward)   Start     Dose/Rate Route Frequency Ordered Stop   01/26/21 0100  piperacillin-tazobactam (ZOSYN) IVPB 3.375 g        3.375 g 12.5 mL/hr over 240 Minutes Intravenous Every 8 hours 01/25/21 1832 01/31/21 0059   01/25/21 1930  piperacillin-tazobactam (ZOSYN) IVPB 3.375 g        3.375 g 100 mL/hr over 30 Minutes  Intravenous  Once 01/25/21 1832 01/25/21 2053   01/25/21 1130  Ampicillin-Sulbactam (UNASYN) 3 g in sodium chloride 0.9 % 100 mL IVPB        3 g 200 mL/hr over 30 Minutes Intravenous  Once 01/25/21 1130 01/25/21 1325    .  POD/HD#: 62  65 y/o male with complex soft tissue injury R lower leg related to dog bite   -dog bite   - complex soft tissue injury R lower leg s/p I&D, placement of wound vac            due to logistical issues with the OR we will take the patient back tomorrow for repeat washout and VAC change             Will discuss with Dr. Ulice Bold afterwards what schedule VAC changes she would like prior to returning to the OR next week  Patient will be able to discharge after this next procedure to home with home wound VAC while he awaits for his next procedure              Recommend NWB R leg to protect soft tissue envelope             Night splint when mobilizing and for comfort             Elevate for swelling control    - Pain management:             Multimodal    - ABL anemia/Hemodynamics             Stable   - Medical issues              Per primary                           Polysubstance abuse    - DVT/PE prophylaxis:             Ok to start lovenox for dvt prophylaxis    - ID:              Zosyn x 5 days for dog bite prophylaxis--- day 3 of 5     - Dispo:             Return to OR tomorrow with  Ortho for repeat I&D and VAC change             TOC consult to help arrange home wound VAC   Travis Latin, PA-C [phone] (C) 01/28/2021, 10:07 AM  [facility] [address] [place] [address] [phone] [place]405 [phone] (F)    After 5pm and on the weekends please log on to Amion, go to orthopaedics and the look under the [facility] Call for the provider(s) on call. You can also call our office at [phone] and then follow the prompts to be connected to the call team.

## 2021-01-28 NOTE — Progress Notes (Addendum)
PROGRESS NOTE  Travis [name]  VVO:[id number] DOB: 1956-03-15 DOA: 01/25/2021 PCP: Patient, No Pcp Per (Inactive)   Brief Narrative: Travis [name] is a 65 y.o. male with a history of chronic HFrEF, remote ICH, HTN, EtOH abuse who presented to the ED 6/5 after being mauled by a pit bull with large wounds to the right leg. Orthopedics was consulted, took the patient for OR washout with wound vac placement and plans to repeat washout 01/29/2021. Plastic surgery is also involved, planning  now delayed to 02/05/2021. He continues on zosyn.   Assessment & Plan: Principal Problem:   Dog bite Active Problems:   Accelerated hypertension   Chronic systolic CHF (congestive heart failure) (HCC)   Alcohol dependence (HCC)   Tobacco dependence   Noncompliance with medication regimen   Malnutrition of moderate degree  Right leg wounds from dog attack: s/p washout 6/5 by Dr. Carola Frost with wound vac placement.  - Return to OR 01/29/2021 per orthopedics with plans for discharge home with wound vac and return for plastic surgery (debridement w/wound matrix and wound vac, possible split-thickness skin graft) 02/05/2021.  - Continue zosyn. Per orthopedics, currently planning 5 days Tx 6/6 - 6/10) - The dog in question jumped fence without provocation with severe injuries inflicted. The dog has been taken by its owners who are not cooperating with animal control, so we cannot reliably monitor for symptoms of rabies. The patient is unvaccinated for rabies. We will need to initiate rabies immunization (active and passive) ASAP. Plan to continue vaccination series at 3, 7, 14 days as well. This can be done at urgent care as an outpatient. Patient reports last tetanus vaccination given in ED on arrival.   - High protein diet/supplementation to assist with wound healing  Chronic HFrEF, HTN: Pt not adherent to guideline medical therapy. LVEF 20-25% Jan 2020. Appears euvolemic.  - Resumed coreg, BP at goal currently.   Alcohol  abuse: No evidence of withdrawal or intoxication.  - DC CIWA - Continue folate, thiamine - Moderation counseling provided.   Tobacco use:  - Cessation counseling provided, patch declined.   DVT prophylaxis: Lovenox Code Status: Full Family Communication: Fiance at bedside on 2nd rounds today. Disposition Plan:  Status is: Inpatient  Remains inpatient appropriate because:Inpatient level of care appropriate due to severity of illness  Dispo: The patient is from: Home              Anticipated d/c is to: Home              Patient currently is not medically stable to d/c.   Difficult to place patient No  Consultants:   Orthopedics  Plastic surgery  Procedures:  01/25/21 IRRIGATION AND DEBRIDEMENT LEG Myrene Galas, MD  01/29/21 IRRIGATION AND DEBRIDEMENT EXTREMITY Myrene Galas, MD   Antimicrobials:  Zosyn   Subjective: Pain in leg is moderate 3-4 out of 10. Worse with ambulation. Getting around ok. No fever or other new complaints.   Objective: Vitals:   01/27/21 0403 01/27/21 1250 01/27/21 1944 01/28/21 0710  BP: 132/72 130/80 130/67 (!) 159/80  Pulse: 70 65 69 (!) 55  Resp: 18 14 20 20   Temp: 98.3 F (36.8 C) 97.8 F (36.6 C) 98.5 F (36.9 C) 97.9 F (36.6 C)  TempSrc: Oral Oral Oral Oral  SpO2: 100% 100% 99% 99%  Weight:      Height:        Intake/Output Summary (Last 24 hours) at 01/28/2021 1246 Last data filed at 01/28/2021 0900 Gross  per 24 hour  Intake 997 ml  Output 2550 ml  Net -1553 ml   Filed Weights   01/25/21 1122 01/25/21 1316  Weight: 72.6 kg 72.6 kg    Gen: 65 y.o. male in no distress Pulm: Non-labored breathing room air. Clear to auscultation bilaterally.  CV: Regular rate and rhythm. No murmur, rub, or gallop. No JVD, no pedal edema. GI: Abdomen soft, non-tender, non-distended, with normoactive bowel sounds. No organomegaly or masses felt. Ext: Warm, no deformities Skin: Sanguinous drainage in wound vac, otherwise RLE wrapped and not  undressed today. Neuro: Alert and oriented. No focal neurological deficits. Psych: Judgement and insight appear normal. Mood & affect appropriate.   Data Reviewed: I have personally reviewed following labs and imaging studies  CBC: Recent Labs  Lab 01/25/21 1137 01/26/21 0041 01/27/21 0133  WBC 8.5 10.7* 9.5  NEUTROABS 4.1  --   --   HGB 15.0 13.9 12.6*  HCT 46.0 42.7 38.9*  MCV 75.8* 75.0* 74.4*  PLT 161 186 205   Basic Metabolic Panel: Recent Labs  Lab 01/25/21 1137 01/26/21 0041 01/27/21 0133  NA 135 135 136  K 4.0 3.8 3.9  CL 102 101 100  CO2 20* 24 30  GLUCOSE 94 134* 98  BUN 8 11 10   CREATININE 0.76 0.85 0.76  CALCIUM 8.7* 8.5* 8.6*   GFR: Estimated Creatinine Clearance: 94.5 mL/min (by C-G formula based on SCr of 0.76 mg/dL). Liver Function Tests: No results for input(s): AST, ALT, ALKPHOS, BILITOT, PROT, ALBUMIN in the last 168 hours. No results for input(s): LIPASE, AMYLASE in the last 168 hours. No results for input(s): AMMONIA in the last 168 hours. Coagulation Profile: Recent Labs  Lab 01/25/21 1137  INR 1.0   Cardiac Enzymes: No results for input(s): CKTOTAL, CKMB, CKMBINDEX, TROPONINI in the last 168 hours. BNP (last 3 results) No results for input(s): PROBNP in the last 8760 hours. HbA1C: No results for input(s): HGBA1C in the last 72 hours. CBG: No results for input(s): GLUCAP in the last 168 hours. Lipid Profile: No results for input(s): CHOL, HDL, LDLCALC, TRIG, CHOLHDL, LDLDIRECT in the last 72 hours. Thyroid Function Tests: No results for input(s): TSH, T4TOTAL, FREET4, T3FREE, THYROIDAB in the last 72 hours. Anemia Panel: No results for input(s): VITAMINB12, FOLATE, FERRITIN, TIBC, IRON, RETICCTPCT in the last 72 hours. Urine analysis:    Component Value Date/Time   COLORURINE STRAW (A) 08/26/2018 0345   APPEARANCEUR CLEAR 08/26/2018 0345   LABSPEC 1.004 (L) 08/26/2018 0345   PHURINE 7.0 08/26/2018 0345   GLUCOSEU NEGATIVE  08/26/2018 0345   HGBUR NEGATIVE 08/26/2018 0345   BILIRUBINUR NEGATIVE 08/26/2018 0345   KETONESUR NEGATIVE 08/26/2018 0345   PROTEINUR NEGATIVE 08/26/2018 0345   UROBILINOGEN 0.2 03/11/2014 1113   NITRITE NEGATIVE 08/26/2018 0345   LEUKOCYTESUR NEGATIVE 08/26/2018 0345   Recent Results (from the past 240 hour(s))  Resp Panel by RT-PCR (Flu A&B, Covid) Nasopharyngeal Swab     Status: None   Collection Time: 01/25/21 11:41 AM   Specimen: Nasopharyngeal Swab; Nasopharyngeal(NP) swabs in vial transport medium  Result Value Ref Range Status   SARS Coronavirus 2 by RT PCR NEGATIVE NEGATIVE Final    Comment: (NOTE) SARS-CoV-2 target nucleic acids are NOT DETECTED.  The SARS-CoV-2 RNA is generally detectable in upper respiratory specimens during the acute phase of infection. The lowest concentration of SARS-CoV-2 viral copies this assay can detect is 138 copies/mL. A negative result does not preclude SARS-Cov-2 infection and should not be used  as the sole basis for treatment or other patient management decisions. A negative result may occur with  improper specimen collection/handling, submission of specimen other than nasopharyngeal swab, presence of viral mutation(s) within the areas targeted by this assay, and inadequate number of viral copies(<138 copies/mL). A negative result must be combined with clinical observations, patient history, and epidemiological information. The expected result is Negative.  Fact Sheet for Patients:  BloggerCourse.com  Fact Sheet for Healthcare Providers:  SeriousBroker.it  This test is no t yet approved or cleared by the [place] FDA and  has been authorized for detection and/or diagnosis of SARS-CoV-2 by FDA under an Emergency Use Authorization (EUA). This EUA will remain  in effect (meaning this test can be used) for the duration of the COVID-19 declaration under Section 564(b)(1) of the Act,  21 U.S.C.section 360bbb-3(b)(1), unless the authorization is terminated  or revoked sooner.       Influenza A by PCR NEGATIVE NEGATIVE Final   Influenza B by PCR NEGATIVE NEGATIVE Final    Comment: (NOTE) The Xpert Xpress SARS-CoV-2/FLU/RSV plus assay is intended as an aid in the diagnosis of influenza from Nasopharyngeal swab specimens and should not be used as a sole basis for treatment. Nasal washings and aspirates are unacceptable for Xpert Xpress SARS-CoV-2/FLU/RSV testing.  Fact Sheet for Patients: BloggerCourse.com  Fact Sheet for Healthcare Providers: SeriousBroker.it  This test is not yet approved or cleared by the [place] FDA and has been authorized for detection and/or diagnosis of SARS-CoV-2 by FDA under an Emergency Use Authorization (EUA). This EUA will remain in effect (meaning this test can be used) for the duration of the COVID-19 declaration under Section 564(b)(1) of the Act, 21 U.S.C. section 360bbb-3(b)(1), unless the authorization is terminated or revoked.  Performed at [facility], [address], [place], [place] [address]   Surgical PCR screen     Status: None   Collection Time: 01/25/21  7:38 PM   Specimen: Nasal Mucosa; Nasal Swab  Result Value Ref Range Status   MRSA, PCR NEGATIVE NEGATIVE Final   Staphylococcus aureus NEGATIVE NEGATIVE Final    Comment: (NOTE) The Xpert SA Assay (FDA approved for NASAL specimens in patients 73 years of age and older), is one component of a comprehensive surveillance program. It is not intended to diagnose infection nor to guide or monitor treatment. Performed at [facility], [address], [place], [place] [address]       Radiology Studies: No results found.  Scheduled Meds: . (feeding supplement) PROSource Plus  30 mL Oral BID BM  . carvedilol  3.125 mg Oral BID WC  . chlorhexidine  15 mL Mouth/Throat Once   Or  . mouth rinse  15  mL Mouth Rinse Once  . docusate sodium  100 mg Oral BID  . enoxaparin (LOVENOX) injection  40 mg Subcutaneous Q24H  . feeding supplement  237 mL Oral BID BM  . folic acid  1 mg Oral Daily  . LORazepam  0-4 mg Intravenous Q12H  . multivitamin with minerals  1 tablet Oral Daily  . mupirocin ointment  1 application Nasal BID  . nutrition supplement (JUVEN)  1 packet Oral BID BM  . Tdap  0.5 mL Intramuscular Once  . thiamine  100 mg Oral Daily   Or  . thiamine  100 mg Intravenous Daily   Continuous Infusions: . methocarbamol (ROBAXIN) IV    . piperacillin-tazobactam (ZOSYN)  IV 3.375 g (01/28/21 0043)  LOS: 3 days   Time spent: 35 minutes.  Tyrone Nine, MD [facility] [email or web address] 01/28/2021, 12:46 PM

## 2021-01-28 NOTE — TOC Progression Note (Signed)
Transition of Care [facility]) - Progression Note    Patient Details  Name: Travis [name] MRN: [id number] Date of Birth: 18-Feb-1956  [facility] CM/SW Contact  Lyah Millirons, Adria Devon, RN Phone Number: 01/28/2021, 12:32 PM  Clinical Narrative:    French Ana with [employer] working on home VAC.   Misty Stanley with [facility] accepted referral for home health PT and RN , and aware clarification on home VAC changes will be made tomorrow.   Will need orders and face to face once determination made.     Called Velna Hatchet with [employer] [facility] for tub transfer bench, walker and 3 in 1 .   Expected Discharge Plan: Home w Home Health Services    Expected Discharge Plan and Services Expected Discharge Plan: Home w Home Health Services   Discharge Planning Services: CM Consult Post Acute Care Choice: Home Health,Durable Medical Equipment Living arrangements for the past 2 months: Single Family Home                 DME Arranged: Walker rolling DME Agency: [facility]                   Social Determinants of Health (SDOH) Interventions    Readmission Risk Interventions No flowsheet data found.

## 2021-01-29 ENCOUNTER — Encounter (HOSPITAL_COMMUNITY)
Admission: EM | Disposition: A | Discharge status: Home Health | Payer: Self-pay | Source: Home / Self Care | Attending: Orthopedic Surgery

## 2021-01-29 ENCOUNTER — Inpatient Hospital Stay (HOSPITAL_COMMUNITY): Payer: Medicare Other | Admitting: Certified Registered Nurse Anesthetist

## 2021-01-29 ENCOUNTER — Encounter (HOSPITAL_COMMUNITY): Payer: Self-pay | Admitting: Internal Medicine

## 2021-01-29 HISTORY — PX: I & D EXTREMITY: SHX5045

## 2021-01-29 SURGERY — IRRIGATION AND DEBRIDEMENT EXTREMITY
Anesthesia: General | Laterality: Right

## 2021-01-29 MED ORDER — ONDANSETRON HCL 4 MG/2ML IJ SOLN
INTRAMUSCULAR | Status: AC
  Filled 2021-01-29: qty 4

## 2021-01-29 MED ORDER — FENTANYL CITRATE (PF) 250 MCG/5ML IJ SOLN
INTRAMUSCULAR | Status: DC | PRN
  Administered 2021-01-29: 50 ug via INTRAVENOUS

## 2021-01-29 MED ORDER — ACETAMINOPHEN 500 MG PO TABS: 1000.0000 mg | ORAL_TABLET | Freq: Once | ORAL | Status: DC | PRN

## 2021-01-29 MED ORDER — PROPOFOL 10 MG/ML IV BOLUS
INTRAVENOUS | Status: AC
  Filled 2021-01-29: qty 20

## 2021-01-29 MED ORDER — FENTANYL CITRATE (PF) 250 MCG/5ML IJ SOLN
INTRAMUSCULAR | Status: AC
  Filled 2021-01-29: qty 5

## 2021-01-29 MED ORDER — CELECOXIB 200 MG PO CAPS: 200.0000 mg | ORAL_CAPSULE | Freq: Once | ORAL | Status: AC

## 2021-01-29 MED ORDER — CHLORHEXIDINE GLUCONATE 0.12 % MT SOLN
15.0000 mL | OROMUCOSAL | Status: AC
  Filled 2021-01-29: qty 15

## 2021-01-29 MED ORDER — OXYCODONE HCL 5 MG PO TABS: 5.0000 mg | ORAL_TABLET | Freq: Once | ORAL | Status: DC | PRN

## 2021-01-29 MED ORDER — CELECOXIB 200 MG PO CAPS
ORAL_CAPSULE | ORAL | Status: AC
  Administered 2021-01-29: 200 mg via ORAL
  Filled 2021-01-29: qty 1

## 2021-01-29 MED ORDER — LACTATED RINGERS IV SOLN: INTRAVENOUS | Status: DC

## 2021-01-29 MED ORDER — MIDAZOLAM HCL 2 MG/2ML IJ SOLN
INTRAMUSCULAR | Status: DC | PRN
  Administered 2021-01-29: 2 mg via INTRAVENOUS

## 2021-01-29 MED ORDER — MIDAZOLAM HCL 2 MG/2ML IJ SOLN
INTRAMUSCULAR | Status: AC
  Filled 2021-01-29: qty 2

## 2021-01-29 MED ORDER — ACETAMINOPHEN 10 MG/ML IV SOLN: 1000.0000 mg | Freq: Once | INTRAVENOUS | Status: DC | PRN

## 2021-01-29 MED ORDER — LIDOCAINE HCL (PF) 2 % IJ SOLN
INTRAMUSCULAR | Status: AC
  Filled 2021-01-29: qty 15

## 2021-01-29 MED ORDER — SODIUM CHLORIDE 0.9 % IR SOLN
Status: DC | PRN
  Administered 2021-01-29: 2000 mL

## 2021-01-29 MED ORDER — PHENYLEPHRINE 40 MCG/ML (10ML) SYRINGE FOR IV PUSH (FOR BLOOD PRESSURE SUPPORT)
PREFILLED_SYRINGE | INTRAVENOUS | Status: DC | PRN
  Administered 2021-01-29: 160 ug via INTRAVENOUS
  Administered 2021-01-29 (×2): 120 ug via INTRAVENOUS

## 2021-01-29 MED ORDER — OXYCODONE HCL 5 MG/5ML PO SOLN
5.0000 mg | Freq: Once | ORAL | Status: DC | PRN
Start: 2021-01-29 — End: 2021-01-29

## 2021-01-29 MED ORDER — ACETAMINOPHEN 160 MG/5ML PO SOLN: 1000.0000 mg | Freq: Once | ORAL | Status: DC | PRN

## 2021-01-29 MED ORDER — ACETAMINOPHEN 500 MG PO TABS
ORAL_TABLET | ORAL | Status: AC
  Administered 2021-01-29: 1000 mg via ORAL
  Filled 2021-01-29: qty 2

## 2021-01-29 MED ORDER — LIDOCAINE 2% (20 MG/ML) 5 ML SYRINGE
INTRAMUSCULAR | Status: DC | PRN
  Administered 2021-01-29: 100 mg via INTRAVENOUS

## 2021-01-29 MED ORDER — FENTANYL CITRATE (PF) 100 MCG/2ML IJ SOLN: 25.0000 ug | INTRAMUSCULAR | Status: DC | PRN

## 2021-01-29 MED ORDER — PROPOFOL 10 MG/ML IV BOLUS
INTRAVENOUS | Status: DC | PRN
  Administered 2021-01-29: 140 mg via INTRAVENOUS

## 2021-01-29 MED ORDER — GLYCOPYRROLATE PF 0.2 MG/ML IJ SOSY
PREFILLED_SYRINGE | INTRAMUSCULAR | Status: DC | PRN
  Administered 2021-01-29: .2 mg via INTRAVENOUS

## 2021-01-29 MED ORDER — ACETAMINOPHEN 500 MG PO TABS: 1000.0000 mg | ORAL_TABLET | Freq: Once | ORAL | Status: AC

## 2021-01-29 MED ORDER — DEXAMETHASONE SODIUM PHOSPHATE 10 MG/ML IJ SOLN
INTRAMUSCULAR | Status: AC
  Filled 2021-01-29: qty 1

## 2021-01-29 MED ORDER — DEXAMETHASONE SODIUM PHOSPHATE 10 MG/ML IJ SOLN
INTRAMUSCULAR | Status: DC | PRN
  Administered 2021-01-29: 5 mg via INTRAVENOUS

## 2021-01-29 MED ORDER — CHLORHEXIDINE GLUCONATE 0.12 % MT SOLN
OROMUCOSAL | Status: AC
  Administered 2021-01-29: 15 mL via OROMUCOSAL
  Filled 2021-01-29: qty 15

## 2021-01-29 MED ORDER — EPHEDRINE SULFATE-NACL 50-0.9 MG/10ML-% IV SOSY
PREFILLED_SYRINGE | INTRAVENOUS | Status: DC | PRN
  Administered 2021-01-29: 15 mg via INTRAVENOUS

## 2021-01-29 MED ORDER — 0.9 % SODIUM CHLORIDE (POUR BTL) OPTIME
TOPICAL | Status: DC | PRN
  Administered 2021-01-29: 1000 mL

## 2021-01-29 SURGICAL SUPPLY — 52 items
BNDG COHESIVE 4X5 TAN STRL (GAUZE/BANDAGES/DRESSINGS) ×3 IMPLANT
BNDG ELASTIC 4X5.8 VLCR STR LF (GAUZE/BANDAGES/DRESSINGS) ×2 IMPLANT
BNDG GAUZE ELAST 4 BULKY (GAUZE/BANDAGES/DRESSINGS) ×4 IMPLANT
BRUSH SCRUB EZ PLAIN DRY (MISCELLANEOUS) ×4 IMPLANT
CANISTER WOUNDNEG PRESSURE 500 (CANNISTER) ×2 IMPLANT
COVER SURGICAL LIGHT HANDLE (MISCELLANEOUS) ×6 IMPLANT
COVER WAND RF STERILE (DRAPES) IMPLANT
DRAPE DERMATAC (DRAPES) ×2 IMPLANT
DRAPE U-SHAPE 47X51 STRL (DRAPES) ×3 IMPLANT
DRSG ADAPTIC 3X8 NADH LF (GAUZE/BANDAGES/DRESSINGS) ×3 IMPLANT
DRSG PAD ABDOMINAL 8X10 ST (GAUZE/BANDAGES/DRESSINGS) ×2 IMPLANT
DRSG VAC ATS LRG SENSATRAC (GAUZE/BANDAGES/DRESSINGS) ×2 IMPLANT
ELECT REM PT RETURN 9FT ADLT (ELECTROSURGICAL) ×3
ELECTRODE REM PT RTRN 9FT ADLT (ELECTROSURGICAL) IMPLANT
GAUZE SPONGE 4X4 12PLY STRL (GAUZE/BANDAGES/DRESSINGS) ×3 IMPLANT
GLOVE BIO SURGEON STRL SZ7.5 (GLOVE) ×3 IMPLANT
GLOVE BIO SURGEON STRL SZ8 (GLOVE) ×3 IMPLANT
GLOVE BIOGEL PI IND STRL 7.5 (GLOVE) ×1 IMPLANT
GLOVE BIOGEL PI IND STRL 9 (GLOVE) ×1 IMPLANT
GLOVE BIOGEL PI INDICATOR 7.5 (GLOVE) ×2
GLOVE BIOGEL PI INDICATOR 9 (GLOVE) ×2
GLOVE SRG 8 PF TXTR STRL LF DI (GLOVE) ×1 IMPLANT
GLOVE SURG UNDER POLY LF SZ8 (GLOVE) ×3
GOWN STRL REUS W/ TWL LRG LVL3 (GOWN DISPOSABLE) ×2 IMPLANT
GOWN STRL REUS W/ TWL XL LVL3 (GOWN DISPOSABLE) ×1 IMPLANT
GOWN STRL REUS W/TWL LRG LVL3 (GOWN DISPOSABLE) ×6
GOWN STRL REUS W/TWL XL LVL3 (GOWN DISPOSABLE) ×3
GRAFT MYRIAD 3 LAYER 10X20 (Graft) ×2 IMPLANT
HANDPIECE INTERPULSE COAX TIP (DISPOSABLE)
KIT BASIN OR (CUSTOM PROCEDURE TRAY) ×3 IMPLANT
KIT TURNOVER KIT B (KITS) ×3 IMPLANT
MANIFOLD NEPTUNE II (INSTRUMENTS) ×3 IMPLANT
NS IRRIG 1000ML POUR BTL (IV SOLUTION) ×3 IMPLANT
PACK ORTHO EXTREMITY (CUSTOM PROCEDURE TRAY) ×3 IMPLANT
PAD ARMBOARD 7.5X6 YLW CONV (MISCELLANEOUS) ×6 IMPLANT
PADDING CAST COTTON 6X4 STRL (CAST SUPPLIES) ×3 IMPLANT
POWDER MYRIAD MORCELLS 1000MG (Miscellaneous) ×2 IMPLANT
SET HNDPC FAN SPRY TIP SCT (DISPOSABLE) IMPLANT
SOL PREP POV-IOD 4OZ 10% (MISCELLANEOUS) ×3 IMPLANT
SOL PREP PROV IODINE SCRUB 4OZ (MISCELLANEOUS) ×3 IMPLANT
SPONGE LAP 18X18 RF (DISPOSABLE) ×3 IMPLANT
STOCKINETTE IMPERVIOUS 9X36 MD (GAUZE/BANDAGES/DRESSINGS) IMPLANT
SUT ETHILON 2 0 PSLX (SUTURE) ×2 IMPLANT
SUT PDS AB 2-0 CT1 27 (SUTURE) ×2 IMPLANT
SUT VIC AB 3-0 PS2 18 (SUTURE) ×2 IMPLANT
TOWEL GREEN STERILE (TOWEL DISPOSABLE) ×6 IMPLANT
TOWEL GREEN STERILE FF (TOWEL DISPOSABLE) ×3 IMPLANT
TUBE CONNECTING 12'X1/4 (SUCTIONS) ×1
TUBE CONNECTING 12X1/4 (SUCTIONS) ×2 IMPLANT
UNDERPAD 30X36 HEAVY ABSORB (UNDERPADS AND DIAPERS) ×3 IMPLANT
WATER STERILE IRR 1000ML POUR (IV SOLUTION) ×3 IMPLANT
YANKAUER SUCT BULB TIP NO VENT (SUCTIONS) ×3 IMPLANT

## 2021-01-29 NOTE — Anesthesia Preprocedure Evaluation (Signed)
Anesthesia Evaluation  Patient identified by MRN, date of birth, ID band Patient awake    Reviewed: Allergy & Precautions, NPO status , Patient's Chart, lab work & pertinent test results  Airway Mallampati: II  TM Distance: >3 FB Neck ROM: Full    Dental  (+) Poor Dentition, Missing   Pulmonary neg pulmonary ROS, Current Smoker and Patient abstained from smoking.,    Pulmonary exam normal        Cardiovascular hypertension, Pt. on medications and Pt. on home beta blockers +CHF   Rhythm:Regular Rate:Normal     Neuro/Psych SDH. SAH history    GI/Hepatic negative GI ROS, Neg liver ROS,   Endo/Other  negative endocrine ROS  Renal/GU negative Renal ROS  negative genitourinary   Musculoskeletal Dog bite to right leg   Abdominal (+)  Abdomen: soft. Bowel sounds: normal.  Peds  Hematology  (+) anemia ,   Anesthesia Other Findings   Reproductive/Obstetrics                             Anesthesia Physical  Anesthesia Plan  ASA: 2  Anesthesia Plan: General   Post-op Pain Management:    Induction:   PONV Risk Score and Plan: 1 and Ondansetron and Dexamethasone  Airway Management Planned: LMA  Additional Equipment: None  Intra-op Plan:   Post-operative Plan: Extubation in OR  Informed Consent: I have reviewed the patients History and Physical, chart, labs and discussed the procedure including the risks, benefits and alternatives for the proposed anesthesia with the patient or authorized representative who has indicated his/her understanding and acceptance.     Dental advisory given  Plan Discussed with: CRNA and Anesthesiologist  Anesthesia Plan Comments: (Lab Results      Component                Value               Date                      WBC                      8.5                 01/25/2021                HGB                      15.0                01/25/2021                 HCT                      46.0                01/25/2021                MCV                      75.8 (L)            01/25/2021                PLT                      161  01/25/2021           Lab Results      Component                Value               Date                      NA                       135                 01/25/2021                K                        4.0                 01/25/2021                CO2                      20 (L)              01/25/2021                GLUCOSE                  94                  01/25/2021                BUN                      8                   01/25/2021                CREATININE               0.76                01/25/2021                CALCIUM                  8.7 (L)             01/25/2021                GFRNONAA                 >60                 01/25/2021                GFRAA                    >60                 08/31/2018          )        Anesthesia Quick Evaluation

## 2021-01-29 NOTE — Progress Notes (Signed)
Patient arrived to 6N21 from PACU. Report received from [place], [place]. Patient alert and oriented x4. Dressing clean, dry, and intact. Wound vac cycle on, continous rate of 125. Pt c/o 1 out of 10 pain at surgical site, "Just some discomfort", right foot elevated to pts liking. Patients call bell within reach. Will continue to monitor.

## 2021-01-29 NOTE — Progress Notes (Signed)
Zosyn sent down with transporter to short stay with patient as requested by short stay nurse. Zoyn due @1700 

## 2021-01-29 NOTE — Progress Notes (Signed)
PT Cancellation Note  Patient Details Name: Prayan Ulin MRN: [id number] DOB: 12/07/1955   Cancelled Treatment:    Reason Eval/Treat Not Completed: (P) Medical issues which prohibited therapy (Pt to leave for surgery for repeat I/D any minute, will defer PT needs to post surgery.  Will f/u per POC.)   Juanda Luba Artis Delay 01/29/2021, 3:34 PM  Bonney Leitz , PTA Acute Rehabilitation Services Pager [phone] Office [phone]

## 2021-01-29 NOTE — Anesthesia Procedure Notes (Signed)
Procedure Name: LMA Insertion Date/Time: 01/29/2021 6:25 PM Performed by: De Nurse, CRNA Pre-anesthesia Checklist: Patient identified, Emergency Drugs available, Suction available and Patient being monitored Patient Re-evaluated:Patient Re-evaluated prior to induction Oxygen Delivery Method: Circle System Utilized Preoxygenation: Pre-oxygenation with 100% oxygen Induction Type: IV induction Ventilation: Mask ventilation without difficulty LMA: LMA inserted LMA Size: 4.0 Number of attempts: 1 Placement Confirmation: positive ETCO2 Tube secured with: Tape Dental Injury: Teeth and Oropharynx as per pre-operative assessment

## 2021-01-29 NOTE — Transfer of Care (Signed)
Immediate Anesthesia Transfer of Care Note  Patient: Travis [name]  Procedure(s) Performed: IRRIGATION AND DEBRIDEMENT EXTREMITY with application of Myriad graft and wound vac  (Right)  Patient Location: PACU  Anesthesia Type:General  Level of Consciousness: awake  Airway & Oxygen Therapy: Patient Spontanous Breathing  Post-op Assessment: Report given to RN and Post -op Vital signs reviewed and stable  Post vital signs: Reviewed and stable  Last Vitals:  Vitals Value Taken Time  BP 158/82 01/29/21 2007  Temp    Pulse 60 01/29/21 2009  Resp 14 01/29/21 2009  SpO2 100 % 01/29/21 2009  Vitals shown include unvalidated device data.  Last Pain:  Vitals:   01/29/21 0759  TempSrc:   PainSc: 0-No pain      Patients Stated Pain Goal: 4 (74/12/87 8676)  Complications: No notable events documented.

## 2021-01-29 NOTE — Anesthesia Postprocedure Evaluation (Signed)
Anesthesia Post Note  Patient: Travis [name]  Procedure(s) Performed: IRRIGATION AND DEBRIDEMENT EXTREMITY with application of Myriad graft and wound vac  (Right)     Patient location during evaluation: PACU Anesthesia Type: General Level of consciousness: awake Pain management: pain level controlled Vital Signs Assessment: post-procedure vital signs reviewed and stable Respiratory status: spontaneous breathing, nonlabored ventilation, respiratory function stable and patient connected to nasal cannula oxygen Cardiovascular status: blood pressure returned to baseline and stable Postop Assessment: no apparent nausea or vomiting Anesthetic complications: no   No notable events documented.  Last Vitals:  Vitals:   01/29/21 2025 01/29/21 2040  BP: (!) 169/87 (!) 163/55  Pulse:    Resp: 17 14  Temp:  (!) 36.2 C  SpO2: 100% 100%    Last Pain:  Vitals:   01/29/21 2040  TempSrc:   PainSc: 0-No pain                 [name]

## 2021-01-29 NOTE — Progress Notes (Signed)
I discussed with the patient the risks and benefits of surgery for his right leg debridement and vac change, including the possibility of infection, nerve injury, vessel injury, wound breakdown, arthritis, symptomatic hardware, DVT/ PE, loss of motion, malunion, nonunion, and need for further surgery among others.  We also specifically discussed the need to stage surgery because of the elevated risk of soft tissue breakdown that could lead to amputation and the possibility of application of biologic graft.  He acknowledged these risks and provided consent to proceed.  Myrene Galas, MD [facility], [facility] [phone]

## 2021-01-29 NOTE — Progress Notes (Signed)
PROGRESS NOTE  Travis [name]  FAO:[id number] DOB: 1956-08-17 DOA: 01/25/2021 PCP: Patient, No Pcp Per (Inactive)   Brief Narrative: Travis [name] is a 65 y.o. male with a history of chronic HFrEF, remote ICH, HTN, EtOH abuse who presented to the ED 6/5 after being mauled by a pit bull with large wounds to the right leg. Orthopedics was consulted, took the patient for OR washout with wound vac placement and plans to repeat washout 01/29/2021. Plastic surgery is also involved, planning  now delayed to 02/05/2021. He continues on zosyn and has begun rabies vaccination series.  Assessment & Plan: Principal Problem:   Dog bite Active Problems:   Accelerated hypertension   Chronic systolic CHF (congestive heart failure) (HCC)   Alcohol dependence (HCC)   Tobacco dependence   Noncompliance with medication regimen   Malnutrition of moderate degree  Right leg wounds from dog attack: s/p washout 6/5 by Dr. Carola Frost with wound vac placement.  - Return to OR 01/29/2021 per orthopedics with plans for discharge home with wound vac and return for plastic surgery (debridement w/wound matrix and wound vac, possible split-thickness skin graft) 02/05/2021.  - Continue zosyn. Per orthopedics, currently planning 5 days Tx 6/6 - 6/10) - The dog in question jumped fence without provocation with severe injuries inflicted. The dog has been taken by its owners who are not cooperating with animal control, so we cannot reliably monitor for symptoms of rabies. The patient is unvaccinated for rabies, vaccination status of dog unknown. Administered rabies passive immunization 6/8 and first vaccine dose 6/8. Plan to continue vaccination series at 3, 7, 14 days as well. This can be done at urgent care as an outpatient, or possibly by HH-RN? Patient reports last tetanus vaccination given in ED on arrival.   - High protein diet/supplementation to assist with wound healing  Chronic HFrEF, HTN: Pt not adherent to guideline medical  therapy. LVEF 20-25% Jan 2020. Appears euvolemic.  - Resumed coreg, BP at goal currently.   Alcohol abuse: No evidence of withdrawal or intoxication.  - DC CIWA - Continue folate, thiamine - Moderation counseling provided.   Tobacco use:  - Cessation counseling provided, patch declined.   DVT prophylaxis: Lovenox Code Status: Full Family Communication: None at bedside Disposition Plan:  Status is: Inpatient  Remains inpatient appropriate because:Inpatient level of care appropriate due to severity of illness  Dispo: The patient is from: Home              Anticipated d/c is to: Home              Patient currently is not medically stable to d/c.   Difficult to place patient No  Consultants:  Orthopedics Plastic surgery  Procedures:  01/25/21 IRRIGATION AND DEBRIDEMENT LEG Myrene Galas, MD  01/29/21 IRRIGATION AND DEBRIDEMENT EXTREMITY Myrene Galas, MD   Antimicrobials: Zosyn   Subjective: No significant pain per patient at rest, moderate with exertion. Receiving norco about once per day none yet today. NPO for repeat I&D. No fever.  Objective: Vitals:   01/28/21 1313 01/28/21 2251 01/29/21 0513 01/29/21 0759  BP: 134/74 119/67 140/80 (!) 142/74  Pulse: 66 (!) 54 61 60  Resp: 17 18 18 18   Temp: 98.2 F (36.8 C) 98.3 F (36.8 C) 97.8 F (36.6 C)   TempSrc: Oral Oral Oral   SpO2: 100% 99% 100% 100%  Weight:      Height:        Intake/Output Summary (Last 24 hours) at 01/29/2021 0851 Last  data filed at 01/29/2021 0742 Gross per 24 hour  Intake 840 ml  Output 950 ml  Net -110 ml   Filed Weights   01/25/21 1122 01/25/21 1316  Weight: 72.6 kg 72.6 kg   Gen: 65 y.o. male in no distress Pulm: Nonlabored breathing room air. Clear. CV: Regular rate and rhythm. No murmur, rub, or gallop. No JVD, no dependent edema. GI: Abdomen soft, non-tender, non-distended, with normoactive bowel sounds.  Ext: Warm, no deformities. NVI distally in RLE, wound vac in place with ACE  wrap over wounds, c/d/i Skin: No new rashes, lesions or ulcers on visualized skin. Neuro: Alert and oriented. No focal neurological deficits. Psych: Judgement and insight appear fair. Mood euthymic & affect congruent. Behavior is appropriate.    Data Reviewed: I have personally reviewed following labs and imaging studies  CBC: Recent Labs  Lab 01/25/21 1137 01/26/21 0041 01/27/21 0133  WBC 8.5 10.7* 9.5  NEUTROABS 4.1  --   --   HGB 15.0 13.9 12.6*  HCT 46.0 42.7 38.9*  MCV 75.8* 75.0* 74.4*  PLT 161 186 205   Basic Metabolic Panel: Recent Labs  Lab 01/25/21 1137 01/26/21 0041 01/27/21 0133  NA 135 135 136  K 4.0 3.8 3.9  CL 102 101 100  CO2 20* 24 30  GLUCOSE 94 134* 98  BUN 8 11 10   CREATININE 0.76 0.85 0.76  CALCIUM 8.7* 8.5* 8.6*    Recent Results (from the past 240 hour(s))  Resp Panel by RT-PCR (Flu A&B, Covid) Nasopharyngeal Swab     Status: None   Collection Time: 01/25/21 11:41 AM   Specimen: Nasopharyngeal Swab; Nasopharyngeal(NP) swabs in vial transport medium  Result Value Ref Range Status   SARS Coronavirus 2 by RT PCR NEGATIVE NEGATIVE Final   Influenza A by PCR NEGATIVE NEGATIVE Final   Influenza B by PCR NEGATIVE NEGATIVE Final  Surgical PCR screen     Status: None   Collection Time: 01/25/21  7:38 PM   Specimen: Nasal Mucosa; Nasal Swab  Result Value Ref Range Status   MRSA, PCR NEGATIVE NEGATIVE Final   Staphylococcus aureus NEGATIVE NEGATIVE Final       LOS: 4 days   Time spent: 25 minutes.  03/27/21, MD [facility] [email or web address] 01/29/2021, 8:51 AM

## 2021-01-30 ENCOUNTER — Encounter (HOSPITAL_COMMUNITY): Payer: Self-pay | Admitting: Orthopedic Surgery

## 2021-01-30 ENCOUNTER — Other Ambulatory Visit (HOSPITAL_COMMUNITY): Payer: Self-pay

## 2021-01-30 LAB — BASIC METABOLIC PANEL
Anion gap: 8 (ref 5–15)
BUN: 13 mg/dL (ref 8–23)
CO2: 27 mmol/L (ref 22–32)
Calcium: 8.9 mg/dL (ref 8.9–10.3)
Chloride: 100 mmol/L (ref 98–111)
Creatinine, Ser: 0.78 mg/dL (ref 0.61–1.24)
GFR, Estimated: >60 mL/min (ref >60)
Glucose, Bld: 149 mg/dL — ABNORMAL HIGH (ref 70–99)
Potassium: 4.1 mmol/L (ref 3.5–5.1)
Sodium: 135 mmol/L (ref 135–145)

## 2021-01-30 LAB — CBC WITH DIFFERENTIAL/PLATELET
Abs Immature Granulocytes: 0.03 10*3/uL (ref 0.00–0.07)
Basophils Absolute: 0 10*3/uL (ref 0.0–0.1)
Basophils Relative: 0 %
Eosinophils Absolute: 0 10*3/uL (ref 0.0–0.5)
Eosinophils Relative: 0 %
HCT: 39.8 % (ref 39.0–52.0)
Hemoglobin: 12.9 g/dL — ABNORMAL LOW (ref 13.0–17.0)
Immature Granulocytes: 0 %
Lymphocytes Relative: 18 %
Lymphs Abs: 1.4 10*3/uL (ref 0.7–4.0)
MCH: 24.4 pg — ABNORMAL LOW (ref 26.0–34.0)
MCHC: 32.4 g/dL (ref 30.0–36.0)
MCV: 75.4 fL — ABNORMAL LOW (ref 80.0–100.0)
Monocytes Absolute: 0.3 10*3/uL (ref 0.1–1.0)
Monocytes Relative: 3 %
Neutro Abs: 6.2 10*3/uL (ref 1.7–7.7)
Neutrophils Relative %: 79 %
Platelets: 233 10*3/uL (ref 150–400)
RBC: 5.28 MIL/uL (ref 4.22–5.81)
RDW: 15.8 % — ABNORMAL HIGH (ref 11.5–15.5)
WBC: 7.8 10*3/uL (ref 4.0–10.5)
nRBC: 0 % (ref 0.0–0.2)

## 2021-01-30 MED ORDER — ADULT MULTIVITAMIN W/MINERALS CH
1.0000 | ORAL_TABLET | Freq: Every day | ORAL | 6 refills | Status: AC
  Filled 2021-01-30: qty 30, 30d supply, fill #0

## 2021-01-30 MED ORDER — VITAMIN C 1000 MG PO TABS
1000.0000 mg | ORAL_TABLET | Freq: Every day | ORAL | 3 refills | Status: AC
  Filled 2021-01-30: qty 30, 30d supply, fill #0

## 2021-01-30 MED ORDER — METHOCARBAMOL 500 MG PO TABS
500.0000 mg | ORAL_TABLET | Freq: Three times a day (TID) | ORAL | 0 refills | Status: AC | PRN
  Filled 2021-01-30 (×2): qty 30, 10d supply, fill #0

## 2021-01-30 MED ORDER — HYDROCODONE-ACETAMINOPHEN 5-325 MG PO TABS
1.0000 | ORAL_TABLET | Freq: Three times a day (TID) | ORAL | 0 refills | Status: DC | PRN
  Filled 2021-01-30 – 2021-02-12 (×4): qty 20, 7d supply, fill #0

## 2021-01-30 MED ORDER — ACETAMINOPHEN 325 MG PO TABS
650.0000 mg | ORAL_TABLET | Freq: Four times a day (QID) | ORAL | 0 refills | Status: AC | PRN
  Filled 2021-01-30: qty 60, 8d supply, fill #0

## 2021-01-30 MED ORDER — ENSURE ENLIVE PO LIQD
237.0000 mL | Freq: Two times a day (BID) | ORAL | 12 refills | Status: AC
  Filled 2021-01-30: qty 237, 1d supply, fill #0

## 2021-01-30 MED ORDER — VITAMIN D 125 MCG (5000 UT) PO CAPS
1.0000 | ORAL_CAPSULE | Freq: Every day | ORAL | 6 refills | Status: AC
  Filled 2021-01-30: qty 30, fill #0

## 2021-01-30 MED ORDER — MUPIROCIN 2 % EX OINT
1.0000 "application " | TOPICAL_OINTMENT | Freq: Two times a day (BID) | CUTANEOUS | 0 refills | Status: AC
  Filled 2021-01-30 – 2021-02-12 (×3): qty 22, 11d supply, fill #0

## 2021-01-30 NOTE — Progress Notes (Addendum)
[facility] Progress Note  Patient ID: Travis [name] MRN: [id number] DOB/AGE: 12/11/1955 65 y.o.  ADDENDUM:   Because of his fragile social situation, discharge to home is too risky and would significantly increase risk of limb loss. Consequently, he will remain an inpatient until coverage has been accomplished.  Myrene Galas, MD [facility], [facility] [phone]   Subjective:  Doing well No complaints  Plan for split thickness skin graft on Thursday 02/05/2021 by [place].    Home with home vac No vac changes needed  ROS As above  Objective:   VITALS:   Vitals:   01/29/21 2025 01/29/21 2040 01/30/21 0211 01/30/21 0627  BP: (!) 169/87 (!) 163/55 (!) 151/86 (!) 153/81  Pulse:   (!) 50 (!) 51  Resp: 17 14 16 16   Temp:  (!) 97.2 F (36.2 C) 97.8 F (36.6 C) 98.2 F (36.8 C)  TempSrc:   Oral Oral  SpO2: 100% 100% 100% 100%  Weight:      Height:        Estimated body mass index is 22.96 kg/m as calculated from the following:   Height as of this encounter: 5\' 10"  (1.778 m).   Weight as of this encounter: 72.6 kg.   Intake/Output      06/09 0701 06/10 0700 06/10 0701 06/11 0700   P.O. 0    I.V. (mL/kg) 424.1 (5.8)    IV Piggyback 402.5    Total Intake(mL/kg) 826.7 (11.4)    Urine (mL/kg/hr) 1450 (0.8)    Drains 0    Stool     Blood 25    Total Output 1475    Net -648.3           LABS  Results for orders placed or performed during the hospital encounter of 01/25/21 (from the past 24 hour(s))  CBC with Differential/Platelet     Status: Abnormal   Collection Time: 01/30/21  1:32 AM  Result Value Ref Range   WBC 7.8 4.0 - 10.5 K/uL   RBC 5.28 4.22 - 5.81 MIL/uL   Hemoglobin 12.9 (L) 13.0 - 17.0 g/dL   HCT 03/27/21 04/01/21 - 70.1 %   MCV 75.4 (L) 80.0 - 100.0 fL   MCH 24.4 (L) 26.0 - 34.0 pg   MCHC 32.4 30.0 - 36.0 g/dL   RDW 77.9 (H) 39.0 - 30.0 %    Platelets 233 150 - 400 K/uL   nRBC 0.0 0.0 - 0.2 %   Neutrophils Relative % 79 %   Neutro Abs 6.2 1.7 - 7.7 K/uL   Lymphocytes Relative 18 %   Lymphs Abs 1.4 0.7 - 4.0 K/uL   Monocytes Relative 3 %   Monocytes Absolute 0.3 0.1 - 1.0 K/uL   Eosinophils Relative 0 %   Eosinophils Absolute 0.0 0.0 - 0.5 K/uL   Basophils Relative 0 %   Basophils Absolute 0.0 0.0 - 0.1 K/uL   Immature Granulocytes 0 %   Abs Immature Granulocytes 0.03 0.00 - 0.07 K/uL  Basic metabolic panel     Status: Abnormal   Collection Time: 01/30/21  1:32 AM  Result Value Ref Range   Sodium 135 135 - 145 mmol/L   Potassium 4.1 3.5 - 5.1 mmol/L   Chloride 100 98 - 111 mmol/L   CO2 27 22 - 32 mmol/L  Glucose, Bld 149 (H) 70 - 99 mg/dL   BUN 13 8 - 23 mg/dL   Creatinine, Ser 1.02 0.61 - 1.24 mg/dL   Calcium 8.9 8.9 - 72.5 mg/dL   GFR, Estimated >36 >64 mL/min   Anion gap 8 5 - 15     PHYSICAL EXAM:   Gen: awake, alert, sitting up in bed, NAD Lungs: unlabored Ext:       Right Lower Extremity             Dressing c/d/i             Ext warm             DPN and TN sensation intact             No SPN noted             EHL, FHL, lesser toe motor intact             Ankle flexion, extension, inversion and eversion grossly intact             No pain out of proportion with passive stretch             VAC functioning, good seal, minimal serosanguinous drainage  Assessment/Plan: 1 Day Post-Op   Principal Problem:   Dog bite Active Problems:   Accelerated hypertension   Chronic systolic CHF (congestive heart failure) (HCC)   Alcohol dependence (HCC)   Tobacco dependence   Noncompliance with medication regimen   Malnutrition of moderate degree   Anti-infectives (From admission, onward)    Start     Dose/Rate Route Frequency Ordered Stop   01/26/21 0100  piperacillin-tazobactam (ZOSYN) IVPB 3.375 g        3.375 g 12.5 mL/hr over 240 Minutes Intravenous Every 8 hours 01/25/21 1832 01/31/21 0059    01/25/21 1930  piperacillin-tazobactam (ZOSYN) IVPB 3.375 g        3.375 g 100 mL/hr over 30 Minutes Intravenous  Once 01/25/21 1832 01/25/21 2053   01/25/21 1130  Ampicillin-Sulbactam (UNASYN) 3 g in sodium chloride 0.9 % 100 mL IVPB        3 g 200 mL/hr over 30 Minutes Intravenous  Once 01/25/21 1130 01/25/21 1325     .  POD/HD#: 1  65 y/o male with complex soft tissue injury R lower leg related to dog bite   -dog bite   - complex soft tissue injury R lower leg s/p I&D, placement of wound vac, serial I&D  Return to OR on 02/05/2021 for STSG   Can DC home with home vac  No vac changes needed  Call office with questions    Will send in pain meds to pharmacy    Will be inpatient 1-2 nights after next procedure              - Pain management:             Multimodal    - ABL anemia/Hemodynamics             Stable   - Medical issues              Per primary                          Polysubstance abuse   - DVT/PE prophylaxis:             lovenox while inpatient  Does not require pharmacologic prophylaxis at dc    -  ID:              Zosyn x 5 days for dog bite prophylaxis completed      - Dispo:             stable for dc to home with home vac and HH services  Return to OR on 6/16   [name], PA-C [phone] (C) 01/30/2021, 10:19 AM  [facility] [address] [place] [address] [phone] [phone] (F)    After 5pm and on the weekends please log on to Amion, go to orthopaedics and the look under the [facility] Call for the provider(s) on call. You can also call our office at [phone] and then follow the prompts to be connected to the call team.

## 2021-01-30 NOTE — Op Note (Signed)
Travis [name], Travis [name] MEDICAL RECORD NO: [id number] ACCOUNT NO: [id number] DATE OF BIRTH: 1956/04/15 FACILITY: [facility] LOCATION: [facility] PHYSICIAN: Astrid Divine. Marcelino Scot, MD  Operative Report   DATE OF PROCEDURE: 01/29/2021  PREOPERATIVE DIAGNOSIS:  Right leg mauling, dog bite with extensive soft tissue loss.  POSTOPERATIVE DIAGNOSIS:  Right leg mauling, dog bite with extensive soft tissue loss.  PROCEDURES:    1.  Excision of skin, subcutaneous tissue, and muscle fascia with preparation for future split-thickness skin grafting, 15 cm x 10 cm, right leg. 2.  Application of Myriad biologic graft 1000 grams plus sheet 15 cm x 10 cm. 3.  Dressing change under anesthesia, right leg, with large wound VAC.  SURGEON:  Astrid Divine. Marcelino Scot, MD  ASSISTANT:  None.  ANESTHESIA:  General.  COMPLICATIONS:  None.  BLOOD LOSS:  Minimal.  PATIENT DISPOSITION:  To PACU.  CONDITION:  Stable.  BRIEF SUMMARY OF INDICATIONS FOR PROCEDURE:  The patient is a 65 year old who had his right leg mauled by a pit bull with extensive full-thickness skin loss on the lateral side as well as deep penetrating wounds on the medial side.  The patient is status  post excisional debridement, wound VAC placement with retention of a large distally based pedicle area of skin.  I did discuss with him preoperatively the need for serial procedures, many of which are to follow, as well as the ongoing risk of infection,  which could be deep and result in limb loss.  The patient acknowledged these risks and did provide consent to proceed.  BRIEF SUMMARY OF PROCEDURE:  The patient was taken to the operating room after administration of preoperative antibiotics. He remained on dog bite prophylaxis with IV Zosyn.   The patient was taken to the operating room where general anesthesia was induced.  The wound VAC was removed from the right leg and a chlorhexidine wash and Betadine scrub and paint on the skin was performed.  After draping,  timeout was held.  The  distally based pedicle traumatic flap remained sealed along the anterior edge where the retention sutures were, but as expected it had demarcated close to its takeoff point distally.  After removal of the sutures, I then excised this in its entirety  close to the pedicle able to routine only about 4 cm of length.  The wound was remeasured and again remained 15 cm x 10 cm.  The medial wounds appeared to be in good overall condition.  There was some exposure of the peroneal tendons posterolaterally.   The fascia over the anterior compartment appeared to have some granulation.  There were areas of fibrinous necrotic material along the intermuscular septum and also some skin edges that were demarcated and black along with some underlying subcutaneous  tissue.  Consequently, using a scalpel as well as a Metzenbaum scissor, I sharply excised these skin edges and subcutaneous tissue and muscle fascia and many small areas over the entirety of the wound bed and this was in direct preparation for the next  procedure, which will be split-thickness skin grafting.  Because of the exposed tendon and the fascial bed, application of biologic graft was indicated.  I used a gram of Myriad powder as well as Myriad sheet securing this down with 3-0 Vicryl in several  areas to keep the powder contained. Over the sheet surgilube was applied and then an Adaptic was placed over both followed by a large wound VAC dressing.  I did also connect over where a single retention suture was  placed into a posterior full-thickness skin wound.   Mepitel dressing was placed on the remaining medial wound.  After this was complete, a sterile gently compressive dressing was placed from foot to knee.  No complications during the procedure.  PROGNOSIS:  The patient remains at high risk for complications, which could result in deep infection, limb loss.  We are hopeful with a biologic graft we will get adequate granulation  over the tendons and this can be minimized.  We will plan for  split-thickness skin grafting with either myself or one of my plastic surgery colleagues late next week or early in the following week. Because of his fragile social situation, he may require inpatient status until that time.   ROH D: 01/30/2021 1:47:22 pm T: 01/30/2021 4:55:00 pm  JOB: [id number]/ [id number]

## 2021-01-30 NOTE — Discharge Instructions (Addendum)
Discharge instructions for home health and patient  Home wound VAC to remain on at all times -125 mmHg Wound VAC to remain in place at all times unless changed by home health nurse. Elevate leg multiple times throughout the day to assist with swelling.  Keep the right lower extremity wrapped with Kerlix and Ace wraps to assist with swelling.  May remove Kerlix and Ace wraps as needed to readjust. No showering or getting the right lower extremity wet. May remove splint as needed for comfort.  Please keep the splint as this may be necessary in the future after skin grafting.  Home health to assist the dressing changes 2 times per week. First dressing change from home health on 02/10/2021.  Recommend dressing changes twice per week with assistance from home health.  Patient to follow-up in plastic surgery office in 2 weeks for reevaluation of right lower extremity. Please have patient call office at [phone] to schedule.  Call plastic surgery office with any questions at [phone]

## 2021-01-30 NOTE — Progress Notes (Signed)
  PROGRESS NOTE  Travis [name]  GMW:[id number] DOB: Nov 26, 1955 DOA: 01/25/2021 PCP: None  Brief Narrative: Travis [name] is a 65 y.o. male with a history of chronic HFrEF, remote ICH, HTN, EtOH abuse who presented to the ED 6/5 after being mauled by a pit bull with large wounds to the right leg. Orthopedics was consulted, took the patient for OR washout with wound vac placement and plans to repeat washout 01/29/2021. Plastic surgery is also involved, planning  now delayed to 02/05/2021. He continues on zosyn and has begun rabies vaccination series.  Procedures:  01/25/21 IRRIGATION AND DEBRIDEMENT LEG Myrene Galas, MD  01/29/21 IRRIGATION AND DEBRIDEMENT EXTREMITY Myrene Galas, MD   Subjective: Feels well. Minimal pain without narcotics. Slow HR without orthostasis or palpitations or mental status changes. Does not take any medications and doesn't see doctors.   Objective: Vitals:   01/29/21 2025 01/29/21 2040 01/30/21 0211 01/30/21 0627  BP: (!) 169/87 (!) 163/55 (!) 151/86 (!) 153/81  Pulse:   (!) 50 (!) 51  Resp: 17 14 16 16   Temp:  (!) 97.2 F (36.2 C) 97.8 F (36.6 C) 98.2 F (36.8 C)  TempSrc:   Oral Oral  SpO2: 100% 100% 100% 100%  Weight:      Height:       Gen: Chronically ill-appearing male in no distress Pulm: Nonlabored breathing room air. Clear. CV: Regular bradycardia. No murmur, rub, or gallop. No JVD, no pitting dependent edema. GI: Abdomen soft, non-tender, non-distended, with normoactive bowel sounds.  Ext: Warm, no deformities. Dressing RLE c/d/I with wound vac in good position. Skin: No new rashes, lesions or ulcers on visualized skin. Neuro: Alert and oriented. No focal neurological deficits. Psych: Judgement and insight appear fair. Mood euthymic & affect congruent. Behavior is appropriate.    Assessment & Plan: Right leg wounds from dog attack: s/p washout 6/5, 6/9 by Dr. 8/9 with wound vac placement.  - Plan to return to OR 6/16 for STSG per Dr. 7/16. The  patient's tenuous social situation, demonstrated difficulty with medical follow up, and known substance abuse (+cocaine) make it very reasonable to keep him in the hospital until that surgery if it can't be moved up. Orthopedics will assume primary care for this patient during that time.  - Has received 5 days of zosyn. Defer to ortho regarding duration moving forward.  - Administered rabies IG 6/9 and initiated vaccination series 6/9. Will need repeat dosing 6/12, 6/16, 6/23. This will be assured by patient remaining in the hospital.  - High protein diet/supplementation to assist with wound healing  Chronic HFrEF, HTN: Pt not adherent to guideline medical therapy. LVEF 20-25% Jan 2020. Appears euvolemic.  - Repeat echocardiogram  - Resumed coreg but is bradycardic and will not be a candidate for DC with beta blocker with cocaine use. Will DC this and make decisions regarding GDMT based on echo results.  Alcohol abuse: No evidence of withdrawal or intoxication.  - Continue folate, thiamine - Moderation counseling provided.   Tobacco use:  - Cessation counseling provided, patch declined.   Cocaine use:  - Cessation counseling provided. Holding BB as above.   Time spent: 25 minutes.  Feb 2020, MD [facility] [email or web address] 01/30/2021, 1:29 PM

## 2021-01-30 NOTE — Progress Notes (Signed)
Physical Therapy Treatment Patient Details Name: Travis [name] MRN: [id number] DOB: 12/06/1955 Today's Date: 01/30/2021    History of Present Illness Travis [name] is a 65 y.o. male admitted on 01/25/2021 with a dog bite on the right leg (deep wounds) after being mauled by pitt bull; 6/5 I & D R leg, wound vac applied. Repeat I & D, with wound vac change on 6/9.  PMH: HTN; ETOH dependence; chronic systolic CHF. He has not had any f/u since his last hospitalization for CHF and does not take medications. Tentative plans for skin graft 02/05/21.    PT Comments     Pt supine in bed on arrival this session.  Focused on gt training with emphasis on pacing. He continues to maintain weight bearing well.  Pt was able to apply night splint on foot and placed in HS stretch with pillow under his ankle.  He continues to progress.  Plan for stair training next session.      Follow Up Recommendations  No PT follow up     Equipment Recommendations  Rolling walker with 5" wheels    Recommendations for Other Services       Precautions / Restrictions Precautions Precautions: Other (comment) Precaution Comments: wound VAC Required Braces or Orthoses: Other Brace Other Brace: resting boot at night for RLE- able to donn 01/30/21 Restrictions Weight Bearing Restrictions: Yes RLE Weight Bearing: Non weight bearing    Mobility  Bed Mobility Overal bed mobility: Modified Independent       Supine to sit: Modified independent (Device/Increase time)          Transfers Overall transfer level: Needs assistance Equipment used: Rolling walker (2 wheeled) Transfers: Sit to/from Stand Sit to Stand: Supervision            Ambulation/Gait Ambulation/Gait assistance: Supervision Gait Distance (Feet): 200 Feet Assistive device: Rolling walker (2 wheeled) Gait Pattern/deviations: Step-to pattern;Trunk flexed (hop to pattern.) Gait velocity: decr   General Gait Details: Assist for safety and lines,  cues for pacing and RW position.   Stairs             Wheelchair Mobility    Modified Rankin (Stroke Patients Only)       Balance Overall balance assessment: Modified Independent Sitting-balance support: No upper extremity supported Sitting balance-Leahy Scale: Good       Standing balance-Leahy Scale: Fair                              Cognition Arousal/Alertness: Awake/alert Behavior During Therapy: WFL for tasks assessed/performed Overall Cognitive Status: Within Functional Limits for tasks assessed                                 General Comments: Pt with safety awarenesss concerns and limited insight to deficits      Exercises Other Exercises Other Exercises: Educated and applied night splint.  Placed pillow under patient's knee to promote knee extension.    General Comments        Pertinent Vitals/Pain Pain Assessment: 0-10 Pain Score: 3  Pain Location: RLE Pain Descriptors / Indicators: Aching;Sore Pain Intervention(s): Monitored during session;Repositioned    Home Living                      Prior Function            PT Goals (current goals  can now be found in the care plan section) Acute Rehab PT Goals Patient Stated Goal: to go home Potential to Achieve Goals: Good Progress towards PT goals: Progressing toward goals    Frequency    Min 3X/week      PT Plan Current plan remains appropriate    Co-evaluation              AM-PAC PT "6 Clicks" Mobility   Outcome Measure  Help needed turning from your back to your side while in a flat bed without using bedrails?: None Help needed moving from lying on your back to sitting on the side of a flat bed without using bedrails?: None Help needed moving to and from a bed to a chair (including a wheelchair)?: A Little Help needed standing up from a chair using your arms (e.g., wheelchair or bedside chair)?: A Little Help needed to walk in hospital  room?: A Little Help needed climbing 3-5 steps with a railing? : A Little 6 Click Score: 20    End of Session Equipment Utilized During Treatment: Gait belt (wound vac) Activity Tolerance: Patient tolerated treatment well Patient left: in bed;with call bell/phone within reach Nurse Communication: Mobility status PT Visit Diagnosis: Unsteadiness on feet (R26.81);Muscle weakness (generalized) (M62.81);Difficulty in walking, not elsewhere classified (R26.2)     Time: 0932-6712 PT Time Calculation (min) (ACUTE ONLY): 17 min  Charges:  $Gait Training: 8-22 mins                     Bonney Leitz , PTA Acute Rehabilitation Services Pager [phone] Office [phone]    Ellizabeth Dacruz Artis Delay 01/30/2021, 4:28 PM

## 2021-01-30 NOTE — Care Management (Addendum)
[facility] requesting op report with wound measurements. Needed for Medicaid to approve home wound VAC. Called Dr Magdalene Patricia office and spoke to [place], she give message to Dr Carola Frost. French Ana with [employer] updated.   1055 Entered order for home VAC with no dressing changes. Angela Burke with [facility] for update.   1255 Called Dr Magdalene Patricia office back to follow up they are closed until 1:15 for lunch.  1355 Patient will be staying at hospital until surgery on 02/05/21. French Ana with [facility] updated and left message for Marylene Land with [facility].

## 2021-01-30 NOTE — Op Note (Signed)
NAMERUSS, LOOPER MEDICAL RECORD NO: [id number] ACCOUNT NO: [id number] DATE OF BIRTH: [birth year] FACILITY: [facility] LOCATION: [facility] PHYSICIAN: Doralee Albino. Carola Frost, MD  Operative Report   DATE OF PROCEDURE: 01/25/2021  PREOPERATIVE DIAGNOSIS:  Right leg complex open wounds from dog mauling.  POSTOPERATIVE DIAGNOSIS:  Right leg complex open wounds from dog mauling.  PROCEDURE:   1.  Exploration of penetrating right lower extremity medial wounds. 2.  Excisional debridement of skin, subcutaneous tissue and muscle, right lateral leg wound, 15 cm x 10 cm plus additional medial wounds. 3.  Complex retention suture closure lateral flap. 4.  Application of large wound VAC.  PATIENT DISPOSITION:  To PACU.  CONDITION:  Stable.  BRIEF SUMMARY OF INDICATIONS FOR PROCEDURE:  The patient is a 65 year old with a past medical history notable for long-term substance abuse and medical noncompliance, who reports having his right leg maul by a pit bull earlier today.  There is extensive  soft tissue loss over the near entirety of the right leg laterally with exposure of tendons and disrupted muscle.  There are also multiple bites on the medial side with additional lacerations.  Some of these are deep and penetrating on the medial side.   He denies other injury.  I did discuss with him the risks and benefits of surgical debridement including the necessity for multiple staged procedures and ultimately soft tissue coverage with a flap or skin grafting.  Furthermore, given the magnitude and  extent of these injuries that amputation was definitely possible in the event of a deep infection and failure of coverage.  He acknowledged these risks and did provide consent to proceed.  We also discussed perioperative complications including heart  attack, stroke and infection.  BRIEF SUMMARY OF THE PROCEDURE:  The patient was given preoperative antibiotics on presentation downstairs, he did receive these in appropriate  manner.  He was taken to the operating room where a chlorhexidine wash was performed including of the wound,  which was irrigated thoroughly, then a Betadine scrub and paint was performed over the skin only and not directly into the open wound.  The leg was draped and then a timeout was held.  We began with exploration of the deep penetrating wounds on the  medial side.  These did go down to bone, but there was no fracture of the medial tibial cortex.  The other deep wounds went down into the muscle belly, but did not damage the nerve or vessel medially.  The extensile open wound had large chunks of muscle  and fascia, which were sharply excised with the scalpel also removed the skin edge and subcutaneous tissue with the scalpel where it was nonviable or extensively contaminated.  The patient also had a singular large skin flap, which was 15 cm in length  and had only a small bridge of 4 cm at its distal edge where the pedicle was attached as it was entirely distal to proximal.  There were again exposed tendons laterally with regard to the peroneals and some fascia remained over the anterior compartment.   In order to prevent desiccation and preserve as much skin as possible after the exploration and excisional debridement, a retention suture closure with far-near-near-far sutures was undertaken of this full thickness flap and it was held in position.  A  large wound VAC was then placed along the undersurface.  Lastly, we did apply a posterior and stirrup splint.  Singular retention sutures were placed on the medial side after aggressive lavage prior to  any closure, 6000 mL of saline supplemented with an  additional chlorhexidine was used thoroughly rinsing and cleansing the wound.  Montez Morita, PA-C was present and assisted throughout.  PROGNOSIS:  The patient will need to return to the OR for treatment by either [place] or our plastic surgeon colleagues. Ultimately, skin grafting does appear to be a viable  option as long as he can avoid a significant infection and desiccation of his  tendons.  He remains at high risk for infection and other complications given his comorbidities including untreated congestive heart failure.   PUS D: 01/30/2021 1:29:19 pm T: 01/30/2021 2:23:00 pm  JOB: [id number]/ [id number]

## 2021-01-31 ENCOUNTER — Other Ambulatory Visit (HOSPITAL_COMMUNITY): Payer: Medicaid Other

## 2021-01-31 ENCOUNTER — Inpatient Hospital Stay (HOSPITAL_COMMUNITY): Payer: Medicare Other

## 2021-01-31 NOTE — Progress Notes (Signed)
Orthopaedic Trauma Service Progress Note  Patient ID: Travis [name] MRN: [id number] DOB/AGE: [birth year] 66 y.o.  Subjective:  No acute issues Doing well  Think it would be safest to keep in the inpatient setting until next grafting procedure later this week  Appreciate medicine following   Has begun rabies vaccination series  Completed zosyn for prophylaxis    ROS As above  Objective:   VITALS:   Vitals:   01/30/21 1509 01/30/21 1947 01/31/21 0456 01/31/21 0800  BP: 140/73 (!) 120/56 132/84 140/80  Pulse: (!) 58 (!) 59 61 70  Resp: 17 16 18 17   Temp: 98.6 F (37 C) 98.4 F (36.9 C) 98.8 F (37.1 C) 98.9 F (37.2 C)  TempSrc: Oral Oral Oral Oral  SpO2: 99% 98% 98% 100%  Weight:      Height:        Estimated body mass index is 22.96 kg/m as calculated from the following:   Height as of this encounter: 5\' 10"  (1.778 m).   Weight as of this encounter: 72.6 kg.   Intake/Output      06/10 0701 06/11 0700 06/11 0701 06/12 0700   P.O. 460    I.V. (mL/kg) 150.6 (2.1)    IV Piggyback 115.4    Total Intake(mL/kg) 726 (10)    Urine (mL/kg/hr) 1670 (1)    Drains     Blood     Total Output 1670    Net -944           LABS  No results found for this or any previous visit (from the past 24 hour(s)).   PHYSICAL EXAM:   Gen: awake, alert, sitting up in bed, NAD Lungs: unlabored Ext:       Right Lower Extremity             Dressing c/d/i             Ext warm             DPN and TN sensation intact             No SPN noted             EHL, FHL, lesser toe motor intact             Ankle flexion, extension, inversion and eversion grossly intact             No pain out of proportion with passive stretch             VAC functioning, good seal, mild serosanguinous drainage  Assessment/Plan: 2 Days Post-Op   Principal Problem:   Dog bite Active Problems:   Accelerated hypertension    Chronic systolic CHF (congestive heart failure) (HCC)   Alcohol dependence (HCC)   Tobacco dependence   Noncompliance with medication regimen   Malnutrition of moderate degree   Anti-infectives (From admission, onward)    Start     Dose/Rate Route Frequency Ordered Stop   01/26/21 0100  piperacillin-tazobactam (ZOSYN) IVPB 3.375 g        3.375 g 12.5 mL/hr over 240 Minutes Intravenous Every 8 hours 01/25/21 1832 01/30/21 2359   01/25/21 1930  piperacillin-tazobactam (ZOSYN) IVPB 3.375 g        3.375 g 100 mL/hr over 30 Minutes Intravenous  Once 01/25/21 1832 01/25/21  2053   01/25/21 1130  Ampicillin-Sulbactam (UNASYN) 3 g in sodium chloride 0.9 % 100 mL IVPB        3 g 200 mL/hr over 30 Minutes Intravenous  Once 01/25/21 1130 01/25/21 1325     .  POD/HD#: 2    64 y/o male with complex soft tissue injury R lower leg related to dog bite   -dog bite   - complex soft tissue injury R lower leg s/p I&D, placement of wound vac            return to OR Thursday for STSG   Mobilize as tolerated  NWB R leg  Night splint for comfort   - Pain management:             Multimodal    - ABL anemia/Hemodynamics             Stable   - Medical issues              Per primary                          Polysubstance abuse   - DVT/PE prophylaxis:  Lovenox    - ID:              Zosyn x 5 days for dog bite prophylaxis--- completed      - Dispo:             OR end of week STSG    Travis Latin, PA-C [phone] (C) 01/31/2021, 12:26 PM  [facility] [address] [place] [address] [phone] [phone] (F)    After 5pm and on the weekends please log on to Amion, go to orthopaedics and the look under the [facility] Call for the provider(s) on call. You can also call our office at [phone] and then follow the prompts to be connected to the call team.

## 2021-01-31 NOTE — Progress Notes (Addendum)
Occupational Therapy Treatment Patient Details Name: Travis [name] MRN: [id number] DOB: 1956/01/25 Today's Date: 01/31/2021    History of present illness Travis [name] is a 65 y.o. male admitted on 01/25/2021 with a dog bite on the right leg (deep wounds) after being mauled by pitt bull; 6/5 I & D R leg, wound vac applied. Repeat I & D, with wound vac change on 6/9.  PMH: HTN; ETOH dependence; chronic systolic CHF. He has not had any f/u since his last hospitalization for CHF and does not take medications. Tentative plans for skin graft 02/05/21.   OT comments  Travis [name] is progressing well, per notes d/c will be planned for after graft placement, likely later this week. Travis [name] continues to mobilize within the room well with rw given supervision and vc for safety however he maintains NWB well. Verbally reviewed lower body compensatory techniques for ADLs, pt verbalized understanding. Pt continues to benefit from OT acutely to maximize safety and indep in all ADLs and mobility. D/c plan remains appropriate.    Follow Up Recommendations  No OT follow up;Supervision - Intermittent    Equipment Recommendations  3 in 1 bedside commode;Tub/shower bench       Precautions / Restrictions Precautions Precautions: Fall Precaution Comments: wound VAC Required Braces or Orthoses: Other Brace Other Brace: resting boot at night for RLE- able to donn 01/30/21 Restrictions Weight Bearing Restrictions: Yes RLE Weight Bearing: Non weight bearing       Mobility Bed Mobility Overal bed mobility: Modified Independent                  Transfers Overall transfer level: Needs assistance Equipment used: Rolling walker (2 wheeled) Transfers: Sit to/from Stand Sit to Stand: Supervision         General transfer comment: verbal cues for impulsivity and safety    Balance Overall balance assessment: Modified Independent                 ADL either performed or assessed with clinical judgement    ADL Overall ADL's : Needs assistance/impaired Eating/Feeding: Independent;Sitting                       Toilet Transfer: Modified Independent;Ambulation;RW;Grab bars;Comfort height toilet Toilet Transfer Details (indicate cue type and reason): continues to require assistance for wound vac and line managment`         Functional mobility during ADLs: Min guard;Rolling walker;Cueing for safety;Cueing for sequencing General ADL Comments: pt continues to do well with mobility and maintaining NWB status however demonstrates some impulsivity, attempting to get up prior to therapist having wound vac and lines managed; pt declined ADLs this session     Vision   Vision Assessment?: No apparent visual deficits          Cognition Arousal/Alertness: Awake/alert Behavior During Therapy: WFL for tasks assessed/performed Overall Cognitive Status: Within Functional Limits for tasks assessed                                 General Comments: Pt with safety awarenesss concerns and limited insight to deficits              General Comments wound vac in tact, ace wrap clean and dry; no new concerns noted    Pertinent Vitals/ Pain       Pain Assessment: Faces Faces Pain Scale: Hurts a little bit Pain Location: RLE Pain Descriptors / Indicators:  Aching;Sore Pain Intervention(s): Monitored during session   Frequency  Min 2X/week        Progress Toward Goals  OT Goals(current goals can now be found in the care plan section)  Progress towards OT goals: Progressing toward goals  Acute Rehab OT Goals Patient Stated Goal: to go home OT Goal Formulation: With patient Time For Goal Achievement: 02/09/21 Potential to Achieve Goals: Good ADL Goals Pt Will Perform Grooming: with modified independence;standing Pt Will Perform Lower Body Bathing: with modified independence;sit to/from stand Pt Will Perform Lower Body Dressing: with modified independence;sit to/from  stand Pt Will Transfer to Toilet: with modified independence;ambulating;bedside commode Pt Will Perform Toileting - Clothing Manipulation and hygiene: with modified independence;sitting/lateral leans;sit to/from stand  Plan Discharge plan remains appropriate       AM-PAC OT "6 Clicks" Daily Activity     Outcome Measure   Help from another person eating meals?: None Help from another person taking care of personal grooming?: None Help from another person toileting, which includes using toliet, bedpan, or urinal?: None Help from another person bathing (including washing, rinsing, drying)?: A Little Help from another person to put on and taking off regular upper body clothing?: None Help from another person to put on and taking off regular lower body clothing?: A Little 6 Click Score: 22    End of Session Equipment Utilized During Treatment: Rolling walker  OT Visit Diagnosis: Other abnormalities of gait and mobility (R26.89);Pain Pain - Right/Left: Right Pain - part of body: Leg   Activity Tolerance Patient tolerated treatment well   Patient Left in chair;with call bell/phone within reach   Nurse Communication Mobility status;Precautions;Weight bearing status;Other (comment)        Time: 4627-0350 OT Time Calculation (min): 11 min  Charges: OT General Charges $OT Visit: 1 Visit OT Treatments $Therapeutic Activity: 8-22 mins     Lidia Clavijo A Tannia Contino 01/31/2021, 2:35 PM

## 2021-01-31 NOTE — Progress Notes (Signed)
  PROGRESS NOTE  Travis [name]  TDD:[id number] DOB: 06/18/1956 DOA: 01/25/2021 PCP: None  Brief Narrative: Travis [name] is a 65 y.o. male with a history of chronic HFrEF, remote ICH, HTN, EtOH abuse who presented to the ED 6/5 after being mauled by a pit bull with large wounds to the right leg. Orthopedics was consulted, took the patient for OR washout with wound vac placement on 6/5, repeat washout 6/9, and plans for additional surgery with split thickness skin graft 02/05/2021. Transferred to orthopedics service on 6/10 as they request he remain inpatient until that surgery due to tenuous social situation and demonstrated unreliable follow up. He has completed 5 days of zosyn and has begun rabies vaccination series.  Procedures:  01/25/21 IRRIGATION AND DEBRIDEMENT LEG Myrene Galas, MD  01/29/21 IRRIGATION AND DEBRIDEMENT EXTREMITY Myrene Galas, MD   Subjective: No fever, pain controlled. Denies current or recent chest pain, dyspnea, palpitations, leg swelling, or orthopnea.   Objective: BP 140/80 (BP Location: Left Arm)   Pulse 70   Temp 98.9 F (37.2 C) (Oral)   Resp 17   Ht 5\' 10"  (1.778 m)   Wt 72.6 kg   SpO2 100%   BMI 22.96 kg/m  Gen: Chronically ill-appearing 65 y.o.male in NAD HEENT: MMM, posterior oropharynx clear, poor dentition Pulm: Non-labored; CTAB, no wheezes  CV: Regular rate, no murmur appreciated; distal pulses intact/symmetric GI: + BS; soft, non-tender, non-distended Skin: RLE w/ACE wrap c/d/I, wound vac with good seal, serosanguinous output.  Neuro: A&Ox3, CN II-XII without deficits   Assessment & Plan: Right leg wounds from dog attack: s/p washout 6/5, 6/9 by Dr. 8/9 with wound vac placement.  - Plan to return to OR 6/16 for STSG per orthopedics who has taken the patient onto their service.  - Administered rabies Ig 6/9 and initiated vaccination series 6/9. Will need repeat dosing 6/12, 6/16, 6/23. This will be assured by patient remaining in the hospital.   - High protein diet/supplementation to assist with wound healing  Chronic HFrEF, HTN: Pt not adherent to guideline medical therapy. LVEF 20-25% Jan 2020. Appears euvolemic.  - Repeat echocardiogram ordered, pending.  - DC'ed coreg with +cocaine and bradycardia.   Alcohol abuse: No evidence of withdrawal or intoxication.  - Continue folate, thiamine - Moderation counseling provided.   Tobacco use:  - Cessation counseling provided, patch declined.   Cocaine use:  - Cessation counseling provided. Holding BB as above.   Time spent: 25 minutes.  Feb 2020, MD [facility] [email or web address] 01/31/2021, 9:07 AM

## 2021-02-01 ENCOUNTER — Inpatient Hospital Stay (HOSPITAL_COMMUNITY): Payer: Medicare Other

## 2021-02-01 DIAGNOSIS — I5022 Chronic systolic (congestive) heart failure: Secondary | ICD-10-CM

## 2021-02-01 LAB — ECHOCARDIOGRAM COMPLETE
AR max vel: 2.2 cm2
AV Area VTI: 2.13 cm2
AV Area mean vel: 2.05 cm2
AV Mean grad: 5 mmHg
AV Peak grad: 10.4 mmHg
Ao pk vel: 1.61 m/s
Area-P 1/2: 3.17 cm2
Calc EF: 31.8 %
Height: 70 in
MV M vel: 4.96 m/s
MV Peak grad: 98.4 mmHg
MV VTI: 1.7 cm2
S' Lateral: 4.7 cm
Single Plane A2C EF: 36.4 %
Single Plane A4C EF: 28.8 %
Weight: 2560.02 oz

## 2021-02-01 MED ORDER — RABIES VACCINE, PCEC IM SUSR
1.0000 mL | Freq: Once | INTRAMUSCULAR | Status: AC
  Administered 2021-02-01: 1 mL via INTRAMUSCULAR
  Filled 2021-02-01: qty 1

## 2021-02-01 MED ORDER — LOSARTAN POTASSIUM 25 MG PO TABS
25.0000 mg | ORAL_TABLET | Freq: Every day | ORAL | Status: DC
  Administered 2021-02-02 – 2021-02-04 (×3): 25 mg via ORAL
  Filled 2021-02-01 (×3): qty 1

## 2021-02-01 NOTE — Progress Notes (Signed)
    Subjective: Patient reports pain as mild to moderate. Somewhat controlled with medicine. Tolerating diet. Urinating. No CP, SOB. Prefers to keep the night splint on during the day as well due to pain relief. Has worked with PT/OT on mobilization OOB. Wound vac in place.  Objective:   VITALS:   Vitals:   01/31/21 1513 01/31/21 2018 02/01/21 0515 02/01/21 0900  BP: 137/64 131/67 (!) 147/86 140/60  Pulse: 66 (!) 57 (!) 59 60  Resp: 17 16 17 18   Temp: 98 F (36.7 C) 98.1 F (36.7 C) 98 F (36.7 C) 98.5 F (36.9 C)  TempSrc: Oral Oral Oral Oral  SpO2: 100% 97% 100% 99%  Weight:      Height:       CBC Latest Ref Rng & Units 01/30/2021 01/27/2021 01/26/2021  WBC 4.0 - 10.5 K/uL 7.8 9.5 10.7(H)  Hemoglobin 13.0 - 17.0 g/dL 12.9(L) 12.6(L) 13.9  Hematocrit 39.0 - 52.0 % 39.8 38.9(L) 42.7  Platelets 150 - 400 K/uL 233 205 186   BMP Latest Ref Rng & Units 01/30/2021 01/27/2021 01/26/2021  Glucose 70 - 99 mg/dL 149(H) 98 134(H)  BUN 8 - 23 mg/dL 13 10 11   Creatinine 0.61 - 1.24 mg/dL 0.78 0.76 0.85  Sodium 135 - 145 mmol/L 135 136 135  Potassium 3.5 - 5.1 mmol/L 4.1 3.9 3.8  Chloride 98 - 111 mmol/L 100 100 101  CO2 22 - 32 mmol/L 27 30 24   Calcium 8.9 - 10.3 mg/dL 8.9 8.6(L) 8.5(L)   Intake/Output      06/11 0701 06/12 0700 06/12 0701 06/13 0700   P.O. 780    I.V. (mL/kg)     IV Piggyback     Total Intake(mL/kg) 780 (10.7)    Urine (mL/kg/hr) 4250 (2.4) 450 (1.8)   Drains 0    Total Output 4250 450   Net -3470 -450           Physical Exam: General: NAD. Sitting up on edge of bed. Calm, comfortable Resp: No increased wob Cardio: regular rate and rhythm ABD soft Neurologically intact MSK Neurovascularly intact Sensation intact distally Intact pulses distally Dorsiflexion/Plantar flexion intact Incision: dressing C/D/I, wound vac in place, good seal, serosanguinous fluid in canister   Assessment: 3 Days Post-Op  S/P Procedure(s) (LRB): IRRIGATION AND DEBRIDEMENT  EXTREMITY with application of Myriad graft and wound vac  (Right) by Dr. Marcelino Scot on 01/29/21  Principal Problem:   Dog bite Active Problems:   Accelerated hypertension   Chronic systolic CHF (congestive heart failure) (HCC)   Alcohol dependence ([place])   Tobacco dependence   Noncompliance with medication regimen   Malnutrition of moderate degree   Plan: Leave wound vac in place. Will return to OR Thursday for STSG Advance diet Incentive Spirometry Elevate and Apply ice  Weightbearing: NWB RLE Insicional and dressing care: Reinforce dressings as needed Orthopedic device(s):  night splint boot Showering: Keep dressing dry VTE prophylaxis: Lovenox 40mg  qd    , SCDs, ambulation Pain control: Continue current regimen Follow - up plan: TBD Contact information:  Edmonia Lynch MD, Aggie Moats PA-C  Dispo:  TBD.    Britt Bottom, PA-C Office [phone] 02/01/2021, 10:31 AM

## 2021-02-01 NOTE — Progress Notes (Signed)
  PROGRESS NOTE  Travis [name]  MWN:[id number] DOB: [birth year] DOA: 01/25/2021 PCP: None  Brief Narrative: Travis [name] is a 65 y.o. male with a history of chronic HFrEF, remote ICH, HTN, EtOH abuse who presented to the ED 6/5 after being mauled by a pit bull with large wounds to the right leg. Orthopedics was consulted, took the patient for OR washout with wound vac placement on 6/5, repeat washout 6/9, and plans for additional surgery with split thickness skin graft 02/05/2021. Transferred to orthopedics service on 6/10 as they request he remain inpatient until that surgery due to tenuous social situation and demonstrated unreliable follow up. He has completed 5 days of zosyn and has begun rabies vaccination series.  Procedures:  01/25/21 IRRIGATION AND DEBRIDEMENT LEG Myrene Galas, MD  01/29/21 IRRIGATION AND DEBRIDEMENT EXTREMITY Myrene Galas, MD   Subjective: No new complaints. No swelling or chest pain.   Objective: BP (!) 153/79 (BP Location: Left Arm)   Pulse 67   Temp 98.2 F (36.8 C) (Oral)   Resp 17   Ht 5\' 10"  (1.778 m)   Wt 72.6 kg   SpO2 96%   BMI 22.96 kg/m  Gen: No distress HEENT: Poor dentition Pulm: Nonlabored, clear CV: RRR, borderline bradycardic GI: Nondistended, +BS Skin: RLE wound dressing c/d/I, good seal on wound vac.  Neuro: Alert, no focal deficits  Echocardiogram 02/01/2021: LVEF improved from Jan 2020 to 30-35%, global LV hypokinesis and severe dilatation, mild LVH, normal RV and PASP. Mild-mod MR, IVC normal.    Assessment & Plan: Right leg wounds from dog attack: s/p washout 6/5, 6/9 by Dr. 8/9 with wound vac placement.  - Plan to return to OR 6/16 for STSG per orthopedics who has taken the patient onto their service.  - Administered rabies Ig 6/9 and initiated vaccination series 6/9, given again 6/12. Will need repeat dosing 6/16, 6/23. This will be assured by patient remaining in the hospital. - High protein diet/supplementation to assist with  wound healing  Chronic HFrEF, HTN: Pt not adherent to guideline medical therapy. LVEF 20-25% Jan 2020 now improved to 30-35% w/improvement in mild-mod MR. Appears euvolemic.  - Start losartan, doubt pt would afford/take entresto.  - DC'ed coreg with +cocaine and bradycardia.  - No indication for diuretic.  Alcohol abuse: No evidence of withdrawal or intoxication.  - Continue folate, thiamine - Moderation counseling provided.   Tobacco use:  - Cessation counseling provided, patch declined.   Cocaine use:  - Cessation counseling provided. Holding BB as above.   Time spent: 25 minutes.  Feb 2020, MD [facility] [email or web address] 02/01/2021, 4:40 PM

## 2021-02-01 NOTE — Progress Notes (Signed)
  Echocardiogram 2D Echocardiogram has been performed.  Gerda Diss 02/01/2021, 12:11 PM

## 2021-02-02 NOTE — Progress Notes (Signed)
Orthopaedic Trauma Service Progress Note  Patient ID: Travis [name] MRN: [id number] DOB/AGE: 66-Dec-1957 66 y.o.  Subjective:  No acute issues Doing well No pain when wearing night splint   ROS As above  Objective:   VITALS:   Vitals:   02/01/21 0900 02/01/21 1526 02/01/21 2031 02/02/21 0540  BP: 140/60 (!) 153/79 134/66 135/78  Pulse: 60 67 (!) 58 63  Resp: 18 17 16 16   Temp: 98.5 F (36.9 C) 98.2 F (36.8 C) 97.8 F (36.6 C) 98.7 F (37.1 C)  TempSrc: Oral Oral Oral Oral  SpO2: 99% 96% 97% 98%  Weight:      Height:        Estimated body mass index is 22.96 kg/m as calculated from the following:   Height as of this encounter: 5\' 10"  (1.778 m).   Weight as of this encounter: 72.6 kg.   Intake/Output      06/12 0701 06/13 0700 06/13 0701 06/14 0700   P.O. 580 240   Total Intake(mL/kg) 580 (8) 240 (3.3)   Urine (mL/kg/hr) 2975 (1.7) 400 (1.1)   Drains 0    Stool 0    Total Output 2975 400   Net -2395 -160        Urine Occurrence 2 x    Stool Occurrence 2 x      LABS  No results found for this or any previous visit (from the past 24 hour(s)).   PHYSICAL EXAM:    Gen: awake, alert, sitting up in bed, NAD Lungs: unlabored Ext:       Right Lower Extremity             Dressing c/d/i             Ext warm             DPN and TN sensation intact             No SPN noted             EHL, FHL, lesser toe motor intact             Ankle flexion, extension, inversion and eversion grossly intact             No pain out of proportion with passive stretch             VAC functioning, good seal, mild serosanguinous drainage    Assessment/Plan: 4 Days Post-Op   Principal Problem:   Dog bite Active Problems:   Accelerated hypertension   Chronic systolic CHF (congestive heart failure) (HCC)   Alcohol dependence (HCC)   Tobacco dependence   Noncompliance with medication regimen    Malnutrition of moderate degree   Anti-infectives (From admission, onward)    Start     Dose/Rate Route Frequency Ordered Stop   01/26/21 0100  piperacillin-tazobactam (ZOSYN) IVPB 3.375 g        3.375 g 12.5 mL/hr over 240 Minutes Intravenous Every 8 hours 01/25/21 1832 01/30/21 2359   01/25/21 1930  piperacillin-tazobactam (ZOSYN) IVPB 3.375 g        3.375 g 100 mL/hr over 30 Minutes Intravenous  Once 01/25/21 1832 01/25/21 2053   01/25/21 1130  Ampicillin-Sulbactam (UNASYN) 3 g in sodium chloride 0.9 % 100 mL IVPB  3 g 200 mL/hr over 30 Minutes Intravenous  Once 01/25/21 1130 01/25/21 1325     .  POD/HD#: 59      65 y/o male with complex soft tissue injury R lower leg related to dog bite   -dog bite   - complex soft tissue injury R lower leg s/p I&D, placement of wound vac            return to OR Thursday for STSG                     Mobilize as tolerated             NWB R leg             Night splint for comfort   - Pain management:             Multimodal    - ABL anemia/Hemodynamics             Stable   - Medical issues              Per primary                          Polysubstance abuse   - DVT/PE prophylaxis:             Lovenox    - ID:              Zosyn x 5 days for dog bite prophylaxis--- completed      - Dispo:             OR end of week STSG (Thursday)   [name], PA-C [phone] (C) 02/02/2021, 12:08 PM  [facility] [address] [place] [address] [phone] [place] [phone] (F)    After 5pm and on the weekends please log on to Amion, go to orthopaedics and the look under the [facility] Call for the provider(s) on call. You can also call our office at [phone] and then follow the prompts to be connected to the call team.

## 2021-02-02 NOTE — Progress Notes (Signed)
Physical Therapy Treatment Patient Details Name: Travis [name] MRN: [id number] DOB: 25-Apr-1956 Today's Date: 02/02/2021    History of Present Illness Travis [name] is a 65 y.o. male admitted on 01/25/2021 with a dog bite on the right leg (deep wounds) after being mauled by pitt bull; 6/5 I & D R leg, wound vac applied. Repeat I & D, with wound vac change on 6/9.  PMH: HTN; ETOH dependence; chronic systolic CHF. He has not had any f/u since his last hospitalization for CHF and does not take medications. Tentative plans for skin graft 02/05/21.    PT Comments    Continuing work on functional mobility and activity tolerance;  Pt politely declining getting up and OOB, so focused on therapeutic exercise in the bed this session; Educated pt on the importance of performing exercises to keep atrophy to a minimum while he is NWB RLE; Benefits of hip extension therex also include a component of anterior hip/hip flexor stretching;   Got the ok from Ortho to bear weight through his R knee as tolerated; Can consider knee scooter for mobility, and can work some in R tall kneel to help with hip strengthening and stability while he Is NWB through the foot and lower leg    Follow Up Recommendations  No PT follow up     Equipment Recommendations  Rolling walker with 5" wheels;Other (comment) (May consider knee scooter)    Recommendations for Other Services       Precautions / Restrictions Precautions Precautions: Fall Precaution Comments: wound VAC Required Braces or Orthoses: Other Brace Other Brace: resting boot at night for RLE- able to donn 01/30/21 Restrictions Weight Bearing Restrictions: Yes RLE Weight Bearing: Non weight bearing Other Position/Activity Restrictions: Per Ortho Trauma: we can weight bear through R knee as pt tolerates    Mobility  Bed Mobility                    Transfers                    Ambulation/Gait                 Stairs              Wheelchair Mobility    Modified Rankin (Stroke Patients Only)       Balance                                            Cognition Arousal/Alertness: Awake/alert Behavior During Therapy: WFL for tasks assessed/performed Overall Cognitive Status: Within Functional Limits for tasks assessed                                        Exercises General Exercises - Lower Extremity Quad Sets: AROM;Right;10 reps Gluteal Sets: AROM;Both;10 reps Short Arc Quad: AROM;Right;10 reps Hip ABduction/ADduction: AROM;Right;10 reps;Sidelying Other Exercises Other Exercises: Bolstered bridging with bolster under R thigh x10 Other Exercises: R hip extension in L sidelying x 10; tactile cueing for form    General Comments General comments (skin integrity, edema, etc.): Wound vac intact      Pertinent Vitals/Pain Pain Assessment: Faces Faces Pain Scale: Hurts a little bit Pain Location: RLE Pain Descriptors / Indicators: Aching;Sore Pain Intervention(s): Monitored during session    Home  Living                      Prior Function            PT Goals (current goals can now be found in the care plan section) Acute Rehab PT Goals Patient Stated Goal: to go home PT Goal Formulation: With patient Time For Goal Achievement: 02/09/21 Potential to Achieve Goals: Good Progress towards PT goals: Progressing toward goals    Frequency    Min 3X/week      PT Plan Current plan remains appropriate    Co-evaluation              AM-PAC PT "6 Clicks" Mobility   Outcome Measure  Help needed turning from your back to your side while in a flat bed without using bedrails?: None Help needed moving from lying on your back to sitting on the side of a flat bed without using bedrails?: None Help needed moving to and from a bed to a chair (including a wheelchair)?: A Little Help needed standing up from a chair using your arms (e.g., wheelchair or  bedside chair)?: A Little Help needed to walk in hospital room?: A Little Help needed climbing 3-5 steps with a railing? : A Little 6 Click Score: 20    End of Session   Activity Tolerance: Patient tolerated treatment well Patient left: in bed;with call bell/phone within reach Nurse Communication: Mobility status PT Visit Diagnosis: Unsteadiness on feet (R26.81);Muscle weakness (generalized) (M62.81);Difficulty in walking, not elsewhere classified (R26.2)     Time: 3295-1884 PT Time Calculation (min) (ACUTE ONLY): 18 min  Charges:  $Therapeutic Exercise: 8-22 mins                     Van Clines, PT  Acute Rehabilitation Services Pager [phone] Office [phone]    Levi Aland 02/02/2021, 3:34 PM

## 2021-02-02 NOTE — Progress Notes (Signed)
  PROGRESS NOTE  Travis [name]  OHY:[id number] DOB: [birth year] DOA: 01/25/2021 PCP: None  Brief Narrative: Travis [name] is a 65 y.o. male with a history of chronic HFrEF, remote ICH, HTN, EtOH abuse who presented to the ED 6/5 after being mauled by a pit bull with large wounds to the right leg. Orthopedics was consulted, took the patient for OR washout with wound vac placement on 6/5, repeat washout 6/9, and plans for additional surgery with split thickness skin graft 02/05/2021. Transferred to orthopedics service on 6/10 as they request he remain inpatient until that surgery due to tenuous social situation and demonstrated unreliable follow up. He has completed 5 days of zosyn and has begun rabies vaccination series.  Procedures:  01/25/21 IRRIGATION AND DEBRIDEMENT LEG Myrene Galas, MD  01/29/21 IRRIGATION AND DEBRIDEMENT EXTREMITY Myrene Galas, MD   Subjective: No new complaints. No swelling or chest pain.   Objective: BP 132/82 (BP Location: Left Arm)   Pulse 78   Temp 98.7 F (37.1 C) (Oral)   Resp 17   Ht 5\' 10"  (1.778 m)   Wt 72.6 kg   SpO2 99%   BMI 22.96 kg/m  Gen: No distress HEENT: MMM, poor dentition Pulm: Clear, nonlabored CV: [place] regular bradycardia, soft murmur at apex, no JVD or swelling. GI: +BS, soft, NT Skin: No new rashes or wounds.  Neuro: Alert, oriented  Echocardiogram 02/01/2021: LVEF improved from Jan 2020 to 30-35%, global LV hypokinesis and severe dilatation, mild LVH, normal RV and PASP. Mild-mod MR, IVC normal.    Assessment & Plan: Right leg wounds from dog attack: s/p washout 6/5, 6/9 by Dr. 8/9 with wound vac placement.  - Plan to return to OR 6/16 for STSG per orthopedics (primary service) - Administered rabies Ig 6/9 and initiated vaccination series 6/9, given again 6/12. Will need repeat dosing 6/16, 6/23.  - High protein diet/supplementation to assist with wound healing  Chronic HFrEF, HTN: Pt not adherent to guideline medical  therapy. LVEF 20-25% Jan 2020 now improved to 30-35% w/improvement in mild-mod MR. Appears euvolemic.  - Started losartan, doubt pt would afford/take entresto. Will monitor BP. Recheck labs probably Thursday.   - DC'ed coreg with +cocaine and bradycardia.  - No indication for diuretic.  Alcohol abuse: No evidence of withdrawal or intoxication.  - Continue folate, thiamine - Moderation counseling provided.   Tobacco use:  - Cessation counseling provided, patch declined.   Cocaine use:  - Cessation counseling provided. Holding BB as above.   Time spent: 25 minutes.  Sunday, MD [facility] [email or web address] 02/02/2021, 4:31 PM

## 2021-02-02 NOTE — Progress Notes (Signed)
Nutrition Follow-up  DOCUMENTATION CODES:  Non-severe (moderate) malnutrition in context of chronic illness  INTERVENTION:  Continue ProSource Plus BID.  Continue Ensure Enlive BID.  Continue Juven BID.  Continue CIWA.  NUTRITION DIAGNOSIS:  Moderate Malnutrition related to chronic illness as evidenced by mild fat depletion, mild muscle depletion. - ongoing  GOAL:  Patient will meet greater than or equal to 90% of their needs - meeting  MONITOR:  PO intake, Supplement acceptance, Labs, Weight trends, Skin, I & O's  REASON FOR ASSESSMENT:  Consult Assessment of nutrition requirement/status  ASSESSMENT:  65 yo male with a PMH of HTN; ETOH dependence; chronic systolic CHF; and remote h/o ICH presenting with a severe dog bite wound. 6/5 - washout with wound vac placement 6/9 - repeat washout 6/16 - plan for split thickness skin graft  Spoke with pt at bedside. Pt eating well and taking all supplements. Per Epic, pt eating 100% for 6 of the last 8 meals documented.  Admit wt: 72.6 kg Current wt: 72.6 kg  Continue ProSource Plus BID, Ensure Enlive BID, and Juven BID to promote healing,  Medications: reviewed; ProSource Plus BID, colace BID, EE BID, folic acid, MVI with minerals, Juven BID, thiamine, LR @ 10 ml/hr  Labs: reviewed  Diet Order:   Diet Order             Diet - low sodium heart healthy           Diet regular Room service appropriate? Yes; Fluid consistency: Thin  Diet effective now                  EDUCATION NEEDS:  Education needs have been addressed  Skin:  Skin Assessment: Skin Integrity Issues: Skin Integrity Issues:: Incisions Incisions: R leg, closed  Last BM:  02/01/21  Height:  Ht Readings from Last 1 Encounters:  01/29/21 5\' 10"  (1.778 m)   Weight:  Wt Readings from Last 1 Encounters:  01/29/21 72.6 kg   Ideal Body Weight:  75.5 kg  BMI:  Body mass index is 22.96 kg/m.  Estimated Nutritional Needs:  Kcal:   2000-2200 Protein:  105-120 grams Fluid:  >2 L  03/31/21, RD, LDN Registered Dietitian I After-Hours/Weekend Pager # in [place]

## 2021-02-03 LAB — GLUCOSE, CAPILLARY: Glucose-Capillary: 140 mg/dL — ABNORMAL HIGH (ref 70–99)

## 2021-02-03 NOTE — Progress Notes (Signed)
5 Days Post-Op  Subjective: Patient reports she is doing well.  No issues.  Reports no pain.  Reports feeling much better after placement of right foot splint/boot.  He is hopeful to be home by Friday for his birthday.    Objective: Vital signs in last 24 hours: Temp:  [98 F (36.7 C)-98.6 F (37 C)] 98.6 F (37 C) (06/14 1355) Pulse Rate:  [65-84] 84 (06/14 1355) Resp:  [16-19] 19 (06/14 1355) BP: (112-135)/(56-69) 112/56 (06/14 1355) SpO2:  [98 %-100 %] 100 % (06/14 1355) Last BM Date: 02/03/21  Intake/Output from previous day: 06/13 0701 - 06/14 0700 In: 1140 [P.O.:1140] Out: 2875 [Urine:2875] Intake/Output this shift: Total I/O In: 240 [P.O.:240] Out: 200 [Urine:200]  General appearance: alert, cooperative, no distress, and sitting up in bedside Lungs: Unlabored Extremities: Right lower extremity dressing clear dry and intact, extremity warm.  Wound VAC in place with good seal noted.  Ace wrap in place.  Wound VAC canister with mild serosanguineous drainage.  Lab Results:  CBC Latest Ref Rng & Units 01/30/2021 01/27/2021 01/26/2021  WBC 4.0 - 10.5 K/uL 7.8 9.5 10.7(H)  Hemoglobin 13.0 - 17.0 g/dL 12.9(L) 12.6(L) 13.9  Hematocrit 39.0 - 52.0 % 39.8 38.9(L) 42.7  Platelets 150 - 400 K/uL 233 205 186    BMET No results for input(s): NA, K, CL, CO2, GLUCOSE, BUN, CREATININE, CALCIUM in the last 72 hours. PT/INR No results for input(s): LABPROT, INR in the last 72 hours. ABG No results for input(s): PHART, HCO3 in the last 72 hours.  Invalid input(s): PCO2, PO2  Studies/Results: No results found.  Anti-infectives: Anti-infectives (From admission, onward)    Start     Dose/Rate Route Frequency Ordered Stop   01/26/21 0100  piperacillin-tazobactam (ZOSYN) IVPB 3.375 g        3.375 g 12.5 mL/hr over 240 Minutes Intravenous Every 8 hours 01/25/21 1832 01/30/21 2359   01/25/21 1930  piperacillin-tazobactam (ZOSYN) IVPB 3.375 g        3.375 g 100 mL/hr over 30 Minutes  Intravenous  Once 01/25/21 1832 01/25/21 2053   01/25/21 1130  Ampicillin-Sulbactam (UNASYN) 3 g in sodium chloride 0.9 % 100 mL IVPB        3 g 200 mL/hr over 30 Minutes Intravenous  Once 01/25/21 1130 01/25/21 1325       Assessment/Plan:  Plan to return to the OR on Thursday 02/05/2021 for debridement of right lower extremity wound, application of wound matrix, possible application of split-thickness skin graft and application of wound VAC.  All of the patient's questions were answered to his content.  We discussed the risks associated with the procedure.  DVT prophylaxis per primary team. N.p.o. after midnight for surgery on 02/05/2021    LOS: 9 days    Norvil Martensen J Larson Limones, PA-C 02/03/2021  

## 2021-02-03 NOTE — H&P (View-Only) (Signed)
5 Days Post-Op  Subjective: Patient reports she is doing well.  No issues.  Reports no pain.  Reports feeling much better after placement of right foot splint/boot.  He is hopeful to be home by Friday for his birthday.    Objective: Vital signs in last 24 hours: Temp:  [98 F (36.7 C)-98.6 F (37 C)] 98.6 F (37 C) (06/14 1355) Pulse Rate:  [65-84] 84 (06/14 1355) Resp:  [16-19] 19 (06/14 1355) BP: (112-135)/(56-69) 112/56 (06/14 1355) SpO2:  [98 %-100 %] 100 % (06/14 1355) Last BM Date: 02/03/21  Intake/Output from previous day: 06/13 0701 - 06/14 0700 In: 1140 [P.O.:1140] Out: 2875 [Urine:2875] Intake/Output this shift: Total I/O In: 240 [P.O.:240] Out: 200 [Urine:200]  General appearance: alert, cooperative, no distress, and sitting up in bedside Lungs: Unlabored Extremities: Right lower extremity dressing clear dry and intact, extremity warm.  Wound VAC in place with good seal noted.  Ace wrap in place.  Wound VAC canister with mild serosanguineous drainage.  Lab Results:  CBC Latest Ref Rng & Units 01/30/2021 01/27/2021 01/26/2021  WBC 4.0 - 10.5 K/uL 7.8 9.5 10.7(H)  Hemoglobin 13.0 - 17.0 g/dL 12.9(L) 12.6(L) 13.9  Hematocrit 39.0 - 52.0 % 39.8 38.9(L) 42.7  Platelets 150 - 400 K/uL 233 205 186    BMET No results for input(s): NA, K, CL, CO2, GLUCOSE, BUN, CREATININE, CALCIUM in the last 72 hours. PT/INR No results for input(s): LABPROT, INR in the last 72 hours. ABG No results for input(s): PHART, HCO3 in the last 72 hours.  Invalid input(s): PCO2, PO2  Studies/Results: No results found.  Anti-infectives: Anti-infectives (From admission, onward)    Start     Dose/Rate Route Frequency Ordered Stop   01/26/21 0100  piperacillin-tazobactam (ZOSYN) IVPB 3.375 g        3.375 g 12.5 mL/hr over 240 Minutes Intravenous Every 8 hours 01/25/21 1832 01/30/21 2359   01/25/21 1930  piperacillin-tazobactam (ZOSYN) IVPB 3.375 g        3.375 g 100 mL/hr over 30 Minutes  Intravenous  Once 01/25/21 1832 01/25/21 2053   01/25/21 1130  Ampicillin-Sulbactam (UNASYN) 3 g in sodium chloride 0.9 % 100 mL IVPB        3 g 200 mL/hr over 30 Minutes Intravenous  Once 01/25/21 1130 01/25/21 1325       Assessment/Plan:  Plan to return to the OR on Thursday 02/05/2021 for debridement of right lower extremity wound, application of wound matrix, possible application of split-thickness skin graft and application of wound VAC.  All of the patient's questions were answered to his content.  We discussed the risks associated with the procedure.  DVT prophylaxis per primary team. N.p.o. after midnight for surgery on 02/05/2021    LOS: 9 days    Leslee Home, PA-C 02/03/2021

## 2021-02-03 NOTE — Progress Notes (Addendum)
Occupational Therapy Treatment Patient Details Name: Travis [name] MRN: [id number] DOB: 1956-04-16 Today's Date: 02/03/2021    History of present illness Travis [name] is a 65 y.o. male admitted on 01/25/2021 with a dog bite on the right leg (deep wounds) after being mauled by pitt bull; 6/5 I & D R leg, wound vac applied. Repeat I & D, with wound vac change on 6/9.  PMH: HTN; ETOH dependence; chronic systolic CHF. He has not had any f/u since his last hospitalization for CHF and does not take medications. Tentative plans for skin graft 02/05/21.   OT comments  Travis [name] is progressing well. Upon arrival [facility] was completing BSC transfer and was observed putting is RLE on the floor, suspect with weight going through, with vc pt corrected and maintained NWB. Transfer and hygiene completed Mod I. Educated pt on wear schedule of RLE brace and pt demonstrated great ability to doff/don brace given increased time to problem solve. Per chart with plans for OR tomorrow for skin graft, plan to assess pt's OT needs (if anything changes) post-op; goals updated. D/c plan remains appropriate.   Follow Up Recommendations  No OT follow up;Supervision - Intermittent    Equipment Recommendations  3 in 1 bedside commode;Tub/shower bench       Precautions / Restrictions Precautions Precautions: Fall Precaution Comments: wound VAC Required Braces or Orthoses: Other Brace Other Brace: resting boot at night for RLE- able to donn 01/30/21 Restrictions Weight Bearing Restrictions: Yes RLE Weight Bearing: Non weight bearing Other Position/Activity Restrictions: Per Ortho Trauma: we can weight bear through R knee as pt tolerates       Mobility Bed Mobility Overal bed mobility: Modified Independent Bed Mobility: Supine to Sit     Supine to sit: Modified independent (Device/Increase time)          Transfers Overall transfer level: Needs assistance Equipment used: Rolling walker (2 wheeled) Transfers: Sit  to/from Stand Sit to Stand: Modified independent (Device/Increase time)              Balance Overall balance assessment: Modified Independent Sitting-balance support: No upper extremity supported Sitting balance-Leahy Scale: Good     Standing balance support: No upper extremity supported Standing balance-Leahy Scale: Fair Standing balance comment: standing at sink to wash hands       ADL either performed or assessed with clinical judgement   ADL Overall ADL's : Needs assistance/impaired         Lower Body Dressing: Supervision/safety;Sitting/lateral leans (socks, shoes & brace this session - pants/underswear are supervision with sit<>stand) Lower Body Dressing Details (indicate cue type and reason): Educated pt on brace application - pt demonstrated good ability to doff & don brace indep wtih incrased time for problem solving. Toilet Transfer: Modified Independent;Ambulation;RW;Grab bars;Comfort height toilet Toilet Transfer Details (indicate cue type and reason): continues to require assistance for wound vac and line managment` Toileting- Clothing Manipulation and Hygiene: Modified independent;Sit to/from stand       Functional mobility during ADLs: Supervision/safety;Rolling walker;Cueing for safety General ADL Comments: pt able to transfer/ambulate in room mod I, as long as he does not have to manage wound vac - continues to demonstrate some awareness concerns in regards to wound vac and line management.      Cognition Arousal/Alertness: Awake/alert Behavior During Therapy: WFL for tasks assessed/performed Overall Cognitive Status: Within Functional Limits for tasks assessed             General Comments: good safety awareness that he should not try to  walk into bathroom (odd threshold) by himself              General Comments      Pertinent Vitals/ Pain       Pain Assessment: Faces Faces Pain Scale: Hurts a little bit Pain Location: RLE Pain  Descriptors / Indicators: Aching;Sore Pain Intervention(s): Limited activity within patient's tolerance   Frequency  Min 2X/week        Progress Toward Goals  OT Goals(current goals can now be found in the care plan section)  Progress towards OT goals: Progressing toward goals;Goals met and updated - see care plan  Acute Rehab OT Goals Patient Stated Goal: to go home OT Goal Formulation: With patient Time For Goal Achievement: 02/14/21 Potential to Achieve Goals: Good ADL Goals Pt Will Perform Grooming: standing;with modified independence Pt Will Perform Lower Body Bathing: with modified independence;sit to/from stand Pt Will Perform Lower Body Dressing: with modified independence;sit to/from stand Pt Will Transfer to Toilet: with modified independence;ambulating;regular height toilet Pt Will Perform Toileting - Clothing Manipulation and hygiene: Independently;sitting/lateral leans  Plan Discharge plan remains appropriate       AM-PAC OT "6 Clicks" Daily Activity     Outcome Measure   Help from another person eating meals?: None Help from another person taking care of personal grooming?: None Help from another person toileting, which includes using toliet, bedpan, or urinal?: None Help from another person bathing (including washing, rinsing, drying)?: A Little Help from another person to put on and taking off regular upper body clothing?: None Help from another person to put on and taking off regular lower body clothing?: None 6 Click Score: 23    End of Session Equipment Utilized During Treatment: Rolling walker;Other (comment) (BSC)  OT Visit Diagnosis: Other abnormalities of gait and mobility (R26.89);Pain Pain - Right/Left: Right Pain - part of body: Leg   Activity Tolerance Patient tolerated treatment well   Patient Left in chair;with call bell/phone within reach   Nurse Communication Mobility status;Precautions (Pt had BM)        Time: 3559-7416 OT Time  Calculation (min): 12 min  Charges: OT General Charges $OT Visit: 1 Visit OT Treatments $Self Care/Home Management : 8-22 mins    Travis [name] A Nayzeth Altman 02/03/2021, 4:09 PM

## 2021-02-03 NOTE — Progress Notes (Signed)
  PROGRESS NOTE  Travis [name]  PTW:[id number] DOB: 04-14-1956 DOA: 01/25/2021 PCP: None  Brief Narrative: Travis [name] is a 65 y.o. male with a history of chronic HFrEF, remote ICH, HTN, EtOH abuse who presented to the ED 6/5 after being mauled by a pit bull with large wounds to the right leg. Orthopedics was consulted, took the patient for OR washout with wound vac placement on 6/5, repeat washout 6/9, and plans for additional surgery with split thickness skin graft 02/05/2021. Transferred to orthopedics service on 6/10 as they request he remain inpatient until that surgery due to tenuous social situation and demonstrated unreliable follow up. He has completed 5 days of zosyn and has begun rabies vaccination series.  Procedures:  01/25/21 IRRIGATION AND DEBRIDEMENT LEG Myrene Galas, MD  01/29/21 IRRIGATION AND DEBRIDEMENT EXTREMITY Myrene Galas, MD   Subjective: No overnight issues. Denies dyspnea, lightheadedness.   Objective: BP 135/69 (BP Location: Left Arm)   Pulse 65   Temp 98 F (36.7 C) (Oral)   Resp 16   Ht 5\' 10"  (1.778 m)   Wt 72.6 kg   SpO2 99%   BMI 22.96 kg/m  Gen: No distress Neuro: Alert oriented  Echocardiogram 02/01/2021: LVEF improved from Jan 2020 to 30-35%, global LV hypokinesis and severe dilatation, mild LVH, normal RV and PASP. Mild-mod MR, IVC normal.    Assessment & Plan: Right leg wounds from dog attack: s/p washout 6/5, 6/9 by Dr. 8/9 with wound vac placement.  - Plan to return to OR 6/16 for STSG per orthopedics (primary service) - Administered rabies Ig 6/9 and initiated vaccination series 6/9, given again 6/12. Will need repeat dosing 6/16, 6/23.  - High protein diet/supplementation to assist with wound healing  Chronic HFrEF, HTN: Pt not adherent to guideline medical therapy. LVEF 20-25% Jan 2020 now improved to 30-35% w/improvement in mild-mod MR. Appears euvolemic.  - Started losartan, doubt pt would afford/take entresto. Will monitor BP. 135/69  this AM. Recheck labs probably Thursday.   - DC'ed coreg with +cocaine and bradycardia.  - No indication for diuretic.  Alcohol abuse: No evidence of withdrawal or intoxication.  - Continue folate, thiamine - Moderation counseling provided.   Tobacco use:  - Cessation counseling provided, patch declined.   Cocaine use:  - Cessation counseling provided. Holding BB as above.   Time spent: 25 minutes.  Wednesday, MD [facility] [email or web address] 02/03/2021, 11:12 AM

## 2021-02-03 NOTE — Progress Notes (Signed)
Physical Therapy Treatment Patient Details Name: Raesean Bartoletti MRN: [id number] DOB: 1956-02-06 Today's Date: 02/03/2021    History of Present Illness Travis [name] is a 65 y.o. male admitted on 01/25/2021 with a dog bite on the right leg (deep wounds) after being mauled by pitt bull; 6/5 I & D R leg, wound vac applied. Repeat I & D, with wound vac change on 6/9.  PMH: HTN; ETOH dependence; chronic systolic CHF. He has not had any f/u since his last hospitalization for CHF and does not take medications. Tentative plans for skin graft 02/05/21.    PT Comments    Patient initially reluctant to practice longer walk, however agreed. Reports he has been getting up in room and walking 5 ft to [facility] with nursing aware. He demonstrated good safety awareness in his room. Ambulated 200 ft with pt beginning to put toe of brace on the floor as he fatigued. With cues he maintained NWB RLE. Will need to practice steps next visit.     Follow Up Recommendations  No PT follow up     Equipment Recommendations  Rolling walker with 5" wheels;Other (comment) (May consider knee scooter)    Recommendations for Other Services       Precautions / Restrictions Precautions Precautions: Fall Precaution Comments: wound VAC Required Braces or Orthoses: Other Brace Other Brace: resting boot at night for RLE- able to donn 01/30/21 Restrictions Other Position/Activity Restrictions: Per Ortho Trauma: we can weight bear through R knee as pt tolerates    Mobility  Bed Mobility Overal bed mobility: Modified Independent Bed Mobility: Supine to Sit     Supine to sit: Modified independent (Device/Increase time)          Transfers Overall transfer level: Needs assistance Equipment used: Rolling walker (2 wheeled) Transfers: Sit to/from Stand Sit to Stand: Modified independent (Device/Increase time)            Ambulation/Gait Ambulation/Gait assistance: Supervision Gait Distance (Feet): 200 Feet Assistive  device: Rolling walker (2 wheeled) Gait Pattern/deviations: Step-to pattern;Trunk flexed (hop to pattern.)     General Gait Details: cues to not place Rt toes/foot on the ground as he fatigued   Stairs             Wheelchair Mobility    Modified Rankin (Stroke Patients Only)       Balance Overall balance assessment: Modified Independent Sitting-balance support: No upper extremity supported Sitting balance-Leahy Scale: Good     Standing balance support: No upper extremity supported Standing balance-Leahy Scale: Fair Standing balance comment: standing at sink to wash hands                            Cognition Arousal/Alertness: Awake/alert Behavior During Therapy: WFL for tasks assessed/performed Overall Cognitive Status: Within Functional Limits for tasks assessed                                 General Comments: good safety awareness that he should not try to walk into bathroom (odd threshold) by himself      Exercises Other Exercises Other Exercises: pt reports he has been doing on his own: R hip extension in L sidelying    General Comments        Pertinent Vitals/Pain Pain Assessment: Faces Faces Pain Scale: Hurts a little bit Pain Location: RLE Pain Descriptors / Indicators: Aching;Sore Pain Intervention(s): Limited activity within  patient's tolerance    Home Living                      Prior Function            PT Goals (current goals can now be found in the care plan section) Acute Rehab PT Goals Patient Stated Goal: to go home Time For Goal Achievement: 02/09/21 Potential to Achieve Goals: Good Progress towards PT goals: Progressing toward goals    Frequency    Min 3X/week      PT Plan Current plan remains appropriate    Co-evaluation              AM-PAC PT "6 Clicks" Mobility   Outcome Measure  Help needed turning from your back to your side while in a flat bed without using bedrails?:  None Help needed moving from lying on your back to sitting on the side of a flat bed without using bedrails?: None Help needed moving to and from a bed to a chair (including a wheelchair)?: None Help needed standing up from a chair using your arms (e.g., wheelchair or bedside chair)?: None Help needed to walk in hospital room?: A Little Help needed climbing 3-5 steps with a railing? : A Little 6 Click Score: 22    End of Session Equipment Utilized During Treatment: Other (comment) (wound vac) Activity Tolerance: Patient tolerated treatment well Patient left: with call bell/phone within reach;in chair   PT Visit Diagnosis: Unsteadiness on feet (R26.81);Muscle weakness (generalized) (M62.81);Difficulty in walking, not elsewhere classified (R26.2)     Time: 2423-5361 PT Time Calculation (min) (ACUTE ONLY): 15 min  Charges:  $Gait Training: 8-22 mins                      Jerolyn Center, PT Pager [phone]    Zena Amos 02/03/2021, 3:19 PM

## 2021-02-03 NOTE — Progress Notes (Signed)
Orthopaedic Trauma Service Progress Note  Patient ID: Travis [name] MRN: [id number] DOB/AGE: 1955-08-28 65 y.o.  Subjective:  No acute issues Pain controlled  Possible OR tomorrow with plastics   ROS As above  Objective:   VITALS:   Vitals:   02/02/21 0540 02/02/21 1324 02/02/21 1925 02/03/21 0419  BP: 135/78 132/82 121/66 135/69  Pulse: 63 78 71 65  Resp: 16 17 17 16   Temp: 98.7 F (37.1 C) 98.7 F (37.1 C) 98.3 F (36.8 C) 98 F (36.7 C)  TempSrc: Oral Oral Oral Oral  SpO2: 98% 99% 98% 99%  Weight:      Height:        Estimated body mass index is 22.96 kg/m as calculated from the following:   Height as of this encounter: 5\' 10"  (1.778 m).   Weight as of this encounter: 72.6 kg.   Intake/Output      06/13 0701 06/14 0700 06/14 0701 06/15 0700   P.O. 1140    Total Intake(mL/kg) 1140 (15.7)    Urine (mL/kg/hr) 2875 (1.7)    Drains     Stool     Total Output 2875    Net -1735           LABS  No results found for this or any previous visit (from the past 24 hour(s)).   PHYSICAL EXAM:   Gen: awake, alert, sitting up on EOB, NAD Lungs: unlabored Ext:       Right Lower Extremity             Dressing c/d/i             Ext warm             DPN and TN sensation intact             No SPN noted             EHL, FHL, lesser toe motor intact             Ankle flexion, extension, inversion and eversion grossly intact             No pain out of proportion with passive stretch             VAC functioning, good seal, mild serosanguinous drainage    Assessment/Plan: 5 Days Post-Op   Principal Problem:   Dog bite Active Problems:   Accelerated hypertension   Chronic systolic CHF (congestive heart failure) (HCC)   Alcohol dependence (HCC)   Tobacco dependence   Noncompliance with medication regimen   Malnutrition of moderate degree   Anti-infectives (From admission, onward)     Start     Dose/Rate Route Frequency Ordered Stop   01/26/21 0100  piperacillin-tazobactam (ZOSYN) IVPB 3.375 g        3.375 g 12.5 mL/hr over 240 Minutes Intravenous Every 8 hours 01/25/21 1832 01/30/21 2359   01/25/21 1930  piperacillin-tazobactam (ZOSYN) IVPB 3.375 g        3.375 g 100 mL/hr over 30 Minutes Intravenous  Once 01/25/21 1832 01/25/21 2053   01/25/21 1130  Ampicillin-Sulbactam (UNASYN) 3 g in sodium chloride 0.9 % 100 mL IVPB        3 g 200 mL/hr over 30 Minutes Intravenous  Once 01/25/21 1130 01/25/21 1325     .  POD/HD#: 15  65 y/o male with complex soft tissue injury R lower leg related to dog bite   -dog bite   - complex soft tissue injury R lower leg s/p I&D, placement of wound vac            return to OR tomorrow vs Thursday for STSG                     Mobilize as tolerated             NWB R leg             Night splint for comfort   - Pain management:             Multimodal    - ABL anemia/Hemodynamics             Stable   - Medical issues              Per primary                          Polysubstance abuse   - DVT/PE prophylaxis:             Lovenox    - ID:              Zosyn x 5 days for dog bite prophylaxis--- completed      - Dispo:             NPO after MN in event plastics is able to procure OR space for tomorrow    [name], PA-C [phone] (C) 02/03/2021, 1:45 PM  [facility] [address] [place] [address] [phone] [phone] (F)    After 5pm and on the weekends please log on to Amion, go to orthopaedics and the look under the [facility] Call for the provider(s) on call. You can also call our office at [phone] and then follow the prompts to be connected to the call team.

## 2021-02-04 MED ORDER — RABIES VACCINE, PCEC IM SUSR
1.0000 mL | Freq: Once | INTRAMUSCULAR | Status: AC
  Administered 2021-02-06: 1 mL via INTRAMUSCULAR
  Filled 2021-02-04: qty 1

## 2021-02-04 NOTE — Progress Notes (Signed)
  PROGRESS NOTE  Keshawn Fiorito  PPI:[id number] DOB: Jan 07, 1956 DOA: 01/25/2021 PCP: None  Brief Narrative: Thatcher Doberstein is a 65 y.o. male with a history of chronic HFrEF, remote ICH, HTN, EtOH abuse who presented to the ED 6/5 after being mauled by a pit bull with large wounds to the right leg. Orthopedics was consulted, took the patient for OR washout with wound vac placement on 6/5, repeat washout 6/9, and plans for additional surgery with split thickness skin graft 02/05/2021. Transferred to orthopedics service on 6/10 as they request he remain inpatient until that surgery due to tenuous social situation and demonstrated unreliable follow up. He has completed 5 days of zosyn and has begun rabies vaccination series.  Procedures:  01/25/21 IRRIGATION AND DEBRIDEMENT LEG Myrene Galas, MD  01/29/21 IRRIGATION AND DEBRIDEMENT EXTREMITY Myrene Galas, MD   Subjective: No complaints. Denies chest pain or dyspnea.  Objective: BP (!) 160/74 (BP Location: Right Arm)   Pulse 68   Temp 98.3 F (36.8 C) (Oral)   Resp 18   Ht 5\' 10"  (1.778 m)   Wt 72.6 kg   SpO2 100%   BMI 22.96 kg/m  Gen: No distress CV: RRR, no MRG. No edema  Echocardiogram 02/01/2021: LVEF improved from Jan 2020 to 30-35%, global LV hypokinesis and severe dilatation, mild LVH, normal RV and PASP. Mild-mod MR, IVC normal.    Assessment & Plan: Right leg wounds from dog attack: s/p washout 6/5, 6/9 by Dr. 8/9 with wound vac placement.  - Plan to return to OR 6/16 for STSG by plastic surgery - Administered rabies Ig 6/9 and initiated vaccination series 6/9, given again 6/12. Will need repeat dosing 6/16 (I've ordered for the PM), and final dose due 6/23.  - High protein diet/supplementation to assist with wound healing  Chronic HFrEF, HTN: Pt not adherent to guideline medical therapy. LVEF 20-25% Jan 2020 now improved to 30-35% w/improvement in mild-mod MR. Appears euvolemic.  - Started losartan, doubt pt would afford/take  entresto. Will monitor BP. Recheck Cr, K 6/16. If stable and BP elevated, increase losartan dosing.    - DC'ed coreg with +cocaine and bradycardia.  - No indication for diuretic.  Alcohol abuse: No evidence of withdrawal or intoxication.  - Continue folate, thiamine - Moderation counseling provided.   Tobacco use:  - Cessation counseling provided, patch declined.   Cocaine use:  - Cessation counseling provided. Holding BB as above.   Time spent: 25 minutes.  7/16, MD [facility] [email or web address] 02/04/2021, 10:47 AM

## 2021-02-04 NOTE — Progress Notes (Signed)
Orthopaedic Trauma Service Progress Note  Patient ID: Travis [name] MRN: [id number] DOB/AGE: August 12, 1956 65 y.o.  Subjective:  No issues OR tomorrow with plastics   Would request that plastics assume primary role after OR as Ortho is no longer providing further care   ROS As above  Objective:   VITALS:   Vitals:   02/03/21 0419 02/03/21 1355 02/03/21 2105 02/04/21 0347  BP: 135/69 (!) 112/56 (!) 145/71 (!) 143/79  Pulse: 65 84 79 74  Resp: 16 19 15 16   Temp: 98 F (36.7 C) 98.6 F (37 C) 98.2 F (36.8 C) 98.1 F (36.7 C)  TempSrc: Oral Oral Oral Oral  SpO2: 99% 100% 100% 99%  Weight:      Height:        Estimated body mass index is 22.96 kg/m as calculated from the following:   Height as of this encounter: 5\' 10"  (1.778 m).   Weight as of this encounter: 72.6 kg.   Intake/Output      06/14 0701 06/15 0700 06/15 0701 06/16 0700   P.O. 1040 240   Total Intake(mL/kg) 1040 (14.3) 240 (3.3)   Urine (mL/kg/hr) 850 (0.5) 400 (2.7)   Total Output 850 400   Net +190 -160          LABS  Results for orders placed or performed during the hospital encounter of 01/25/21 (from the past 24 hour(s))  Glucose, capillary     Status: Abnormal   Collection Time: 02/03/21  7:47 PM  Result Value Ref Range   Glucose-Capillary 140 (H) 70 - 99 mg/dL     PHYSICAL EXAM:   Gen: awake, alert, sitting up in bed, NAD Lungs: unlabored Ext:       Right Lower Extremity             Dressing c/d/i             Ext warm             DPN and TN sensation intact             No SPN noted             EHL, FHL, lesser toe motor intact             Ankle flexion, extension, inversion and eversion grossly intact             No pain out of proportion with passive stretch             VAC functioning, good seal, mild serosanguinous drainage  Assessment/Plan: 6 Days Post-Op   Principal Problem:   Dog bite Active  Problems:   Accelerated hypertension   Chronic systolic CHF (congestive heart failure) (HCC)   Alcohol dependence (HCC)   Tobacco dependence   Noncompliance with medication regimen   Malnutrition of moderate degree   Anti-infectives (From admission, onward)    Start     Dose/Rate Route Frequency Ordered Stop   01/26/21 0100  piperacillin-tazobactam (ZOSYN) IVPB 3.375 g        3.375 g 12.5 mL/hr over 240 Minutes Intravenous Every 8 hours 01/25/21 1832 01/30/21 2359   01/25/21 1930  piperacillin-tazobactam (ZOSYN) IVPB 3.375 g        3.375 g 100 mL/hr over 30 Minutes Intravenous  Once 01/25/21 1832 01/25/21 2053  01/25/21 1130  Ampicillin-Sulbactam (UNASYN) 3 g in sodium chloride 0.9 % 100 mL IVPB        3 g 200 mL/hr over 30 Minutes Intravenous  Once 01/25/21 1130 01/25/21 1325     .  POD/HD#: 36  65 y/o male with complex soft tissue injury R lower leg related to dog bite   -dog bite   - complex soft tissue injury R lower leg s/p I&D, placement of wound vac            return to OR tomorrow for STSG                     Mobilize as tolerated             NWB R leg             Night splint for comfort   - Pain management:             Multimodal    - ABL anemia/Hemodynamics             Stable   - Medical issues              Per primary                          Polysubstance abuse   - DVT/PE prophylaxis:             Lovenox    - ID:              Zosyn x 5 days for dog bite prophylaxis--- completed      - Dispo:             NPO after MN    [name], PA-C [phone] (C) 02/04/2021, 9:02 AM  [facility] [address] [place] [address] [phone] [phone] (F)    After 5pm and on the weekends please log on to Amion, go to orthopaedics and the look under the [facility] Call for the provider(s) on call. You can also call our office at [phone] and then follow the prompts to be connected to the call team.

## 2021-02-05 ENCOUNTER — Inpatient Hospital Stay (HOSPITAL_COMMUNITY): Payer: Medicare Other | Admitting: Vascular Surgery

## 2021-02-05 ENCOUNTER — Inpatient Hospital Stay: Admit: 2021-02-05 | Payer: Medicaid Other | Admitting: Orthopedic Surgery

## 2021-02-05 ENCOUNTER — Encounter (HOSPITAL_COMMUNITY)
Admission: EM | Disposition: A | Discharge status: Home Health | Payer: Self-pay | Source: Home / Self Care | Attending: Orthopedic Surgery

## 2021-02-05 DIAGNOSIS — S81801A Unspecified open wound, right lower leg, initial encounter: Secondary | ICD-10-CM

## 2021-02-05 DIAGNOSIS — S81851A Open bite, right lower leg, initial encounter: Secondary | ICD-10-CM

## 2021-02-05 HISTORY — PX: I & D EXTREMITY: SHX5045

## 2021-02-05 HISTORY — PX: APPLICATION OF WOUND VAC: SHX5189

## 2021-02-05 LAB — BASIC METABOLIC PANEL
Anion gap: 7 (ref 5–15)
BUN: 19 mg/dL (ref 8–23)
CO2: 29 mmol/L (ref 22–32)
Calcium: 9.3 mg/dL (ref 8.9–10.3)
Chloride: 102 mmol/L (ref 98–111)
Creatinine, Ser: 0.78 mg/dL (ref 0.61–1.24)
GFR, Estimated: >60 mL/min (ref >60)
Glucose, Bld: 102 mg/dL — ABNORMAL HIGH (ref 70–99)
Potassium: 4 mmol/L (ref 3.5–5.1)
Sodium: 138 mmol/L (ref 135–145)

## 2021-02-05 SURGERY — IRRIGATION AND DEBRIDEMENT EXTREMITY
Anesthesia: General | Site: Leg Lower | Laterality: Right

## 2021-02-05 SURGERY — APPLICATION, GRAFT, SKIN, SPLIT-THICKNESS
Anesthesia: Choice | Laterality: Right

## 2021-02-05 MED ORDER — DEXAMETHASONE SODIUM PHOSPHATE 10 MG/ML IJ SOLN
INTRAMUSCULAR | Status: DC | PRN
  Administered 2021-02-05: 5 mg via INTRAVENOUS

## 2021-02-05 MED ORDER — CHLORHEXIDINE GLUCONATE CLOTH 2 % EX PADS: 6.0000 | MEDICATED_PAD | Freq: Once | CUTANEOUS | Status: DC

## 2021-02-05 MED ORDER — LIDOCAINE 2% (20 MG/ML) 5 ML SYRINGE
INTRAMUSCULAR | Status: DC | PRN
  Administered 2021-02-05: 20 mg via INTRAVENOUS

## 2021-02-05 MED ORDER — LOSARTAN POTASSIUM 50 MG PO TABS
50.0000 mg | ORAL_TABLET | Freq: Every day | ORAL | Status: DC
  Administered 2021-02-05 – 2021-02-06 (×2): 50 mg via ORAL
  Filled 2021-02-05 (×2): qty 1

## 2021-02-05 MED ORDER — CEFAZOLIN SODIUM-DEXTROSE 2-4 GM/100ML-% IV SOLN
INTRAVENOUS | Status: AC
  Filled 2021-02-05: qty 100

## 2021-02-05 MED ORDER — PHENYLEPHRINE 40 MCG/ML (10ML) SYRINGE FOR IV PUSH (FOR BLOOD PRESSURE SUPPORT)
PREFILLED_SYRINGE | INTRAVENOUS | Status: DC | PRN
  Administered 2021-02-05: 80 ug via INTRAVENOUS
  Administered 2021-02-05 (×2): 120 ug via INTRAVENOUS

## 2021-02-05 MED ORDER — BUPIVACAINE HCL (PF) 0.25 % IJ SOLN
INTRAMUSCULAR | Status: AC
  Filled 2021-02-05: qty 30

## 2021-02-05 MED ORDER — FENTANYL CITRATE (PF) 250 MCG/5ML IJ SOLN
INTRAMUSCULAR | Status: AC
  Filled 2021-02-05: qty 5

## 2021-02-05 MED ORDER — PROPOFOL 10 MG/ML IV BOLUS
INTRAVENOUS | Status: DC | PRN
  Administered 2021-02-05 (×2): 50 mg via INTRAVENOUS

## 2021-02-05 MED ORDER — FENTANYL CITRATE (PF) 250 MCG/5ML IJ SOLN
INTRAMUSCULAR | Status: DC | PRN
  Administered 2021-02-05: 50 ug via INTRAVENOUS

## 2021-02-05 MED ORDER — CEFAZOLIN SODIUM-DEXTROSE 2-4 GM/100ML-% IV SOLN
2.0000 g | INTRAVENOUS | Status: AC
  Administered 2021-02-05: 2 g via INTRAVENOUS

## 2021-02-05 MED ORDER — ORAL CARE MOUTH RINSE: 15.0000 mL | Freq: Once | OROMUCOSAL | Status: DC

## 2021-02-05 MED ORDER — 0.9 % SODIUM CHLORIDE (POUR BTL) OPTIME
TOPICAL | Status: DC | PRN
  Administered 2021-02-05: 1000 mL

## 2021-02-05 MED ORDER — CHLORHEXIDINE GLUCONATE 0.12 % MT SOLN
OROMUCOSAL | Status: AC
  Administered 2021-02-05: 15 mL
  Filled 2021-02-05: qty 15

## 2021-02-05 MED ORDER — MIDAZOLAM HCL 2 MG/2ML IJ SOLN
INTRAMUSCULAR | Status: DC | PRN
  Administered 2021-02-05: 2 mg via INTRAVENOUS

## 2021-02-05 MED ORDER — EPINEPHRINE PF 1 MG/ML IJ SOLN
INTRAMUSCULAR | Status: AC
  Filled 2021-02-05: qty 1

## 2021-02-05 MED ORDER — CHLORHEXIDINE GLUCONATE 0.12 % MT SOLN: 15.0000 mL | Freq: Once | OROMUCOSAL | Status: DC

## 2021-02-05 MED ORDER — EPHEDRINE SULFATE-NACL 50-0.9 MG/10ML-% IV SOSY
PREFILLED_SYRINGE | INTRAVENOUS | Status: DC | PRN
  Administered 2021-02-05: 10 mg via INTRAVENOUS

## 2021-02-05 MED ORDER — LIDOCAINE-EPINEPHRINE (PF) 1 %-1:200000 IJ SOLN
INTRAMUSCULAR | Status: AC
  Filled 2021-02-05: qty 30

## 2021-02-05 MED ORDER — LACTATED RINGERS IV SOLN: INTRAVENOUS | Status: DC | PRN

## 2021-02-05 MED ORDER — ONDANSETRON HCL 4 MG/2ML IJ SOLN
INTRAMUSCULAR | Status: DC | PRN
  Administered 2021-02-05: 4 mg via INTRAVENOUS

## 2021-02-05 MED ORDER — MIDAZOLAM HCL 2 MG/2ML IJ SOLN
INTRAMUSCULAR | Status: AC
  Filled 2021-02-05: qty 2

## 2021-02-05 MED ORDER — PROPOFOL 10 MG/ML IV BOLUS
INTRAVENOUS | Status: AC
  Filled 2021-02-05: qty 40

## 2021-02-05 MED ORDER — SODIUM CHLORIDE 0.9 % IV SOLN
INTRAVENOUS | Status: DC | PRN
  Administered 2021-02-05: 500 mL

## 2021-02-05 MED ORDER — LACTATED RINGERS IV SOLN: INTRAVENOUS | Status: DC

## 2021-02-05 SURGICAL SUPPLY — 61 items
APL PRP STRL LF DISP 70% ISPRP (MISCELLANEOUS)
BALL CTTN LRG ABS STRL LF (GAUZE/BANDAGES/DRESSINGS)
BLADE CLIPPER SURG (BLADE) IMPLANT
BLADE DERMATOME SS (BLADE) ×3 IMPLANT
BNDG ELASTIC 4X5.8 VLCR STR LF (GAUZE/BANDAGES/DRESSINGS) ×4 IMPLANT
BNDG ELASTIC 6X5.8 VLCR STR LF (GAUZE/BANDAGES/DRESSINGS) IMPLANT
BNDG GAUZE ELAST 4 BULKY (GAUZE/BANDAGES/DRESSINGS) ×4 IMPLANT
CANISTER SUCT 3000ML PPV (MISCELLANEOUS) ×3 IMPLANT
CANISTER WOUND CARE 500ML ATS (WOUND CARE) IMPLANT
CHLORAPREP W/TINT 26 (MISCELLANEOUS) IMPLANT
COTTONBALL LRG STERILE PKG (GAUZE/BANDAGES/DRESSINGS) IMPLANT
COVER SURGICAL LIGHT HANDLE (MISCELLANEOUS) ×3 IMPLANT
COVER WAND RF STERILE (DRAPES) ×3 IMPLANT
DERMACARRIERS GRAFT 1 TO 1.5 (DISPOSABLE) ×3
DRAPE HALF SHEET 40X57 (DRAPES) ×1 IMPLANT
DRAPE INCISE IOBAN 66X45 STRL (DRAPES) ×3 IMPLANT
DRAPE ORTHO SPLIT 77X108 STRL (DRAPES) ×6
DRAPE SURG ORHT 6 SPLT 77X108 (DRAPES) ×2 IMPLANT
DRESSING HYDROCOLLOID 4X4 (GAUZE/BANDAGES/DRESSINGS) ×1 IMPLANT
DRSG ADAPTIC 3X8 NADH LF (GAUZE/BANDAGES/DRESSINGS) IMPLANT
DRSG CUTIMED SORBACT 7X9 (GAUZE/BANDAGES/DRESSINGS) ×2 IMPLANT
DRSG OPSITE 6X11 MED (GAUZE/BANDAGES/DRESSINGS) IMPLANT
DRSG PAD ABDOMINAL 8X10 ST (GAUZE/BANDAGES/DRESSINGS) ×1 IMPLANT
DRSG TELFA 3X8 NADH (GAUZE/BANDAGES/DRESSINGS) IMPLANT
DRSG VAC ATS LRG SENSATRAC (GAUZE/BANDAGES/DRESSINGS) ×2 IMPLANT
DRSG VAC ATS MED SENSATRAC (GAUZE/BANDAGES/DRESSINGS) IMPLANT
DRSG VAC ATS SM SENSATRAC (GAUZE/BANDAGES/DRESSINGS) IMPLANT
ELECT REM PT RETURN 9FT ADLT (ELECTROSURGICAL) ×3
ELECTRODE REM PT RTRN 9FT ADLT (ELECTROSURGICAL) ×1 IMPLANT
FILTER STRAW FLUID ASPIR (MISCELLANEOUS) ×1 IMPLANT
GAUZE SPONGE 4X4 12PLY STRL (GAUZE/BANDAGES/DRESSINGS) ×1 IMPLANT
GAUZE XEROFORM 5X9 LF (GAUZE/BANDAGES/DRESSINGS) ×1 IMPLANT
GEL ULTRASOUND 20GR AQUASONIC (MISCELLANEOUS) IMPLANT
GLOVE BIO SURGEON STRL SZ 6.5 (GLOVE) ×4 IMPLANT
GLOVE BIO SURGEONS STRL SZ 6.5 (GLOVE) ×2
GLOVE BIOGEL M STRL SZ7.5 (GLOVE) ×3 IMPLANT
GLOVE INDICATOR 8.0 STRL GRN (GLOVE) ×3 IMPLANT
GOWN STRL REUS W/ TWL LRG LVL3 (GOWN DISPOSABLE) ×2 IMPLANT
GOWN STRL REUS W/TWL LRG LVL3 (GOWN DISPOSABLE) ×6
GRAFT DERMACARRIERS 1 TO 1.5 (DISPOSABLE) ×1 IMPLANT
GRAFT MYRIAD 3 LAYER 10X10 (Graft) ×2 IMPLANT
HANDPIECE INTERPULSE COAX TIP (DISPOSABLE)
KIT BASIN OR (CUSTOM PROCEDURE TRAY) ×3 IMPLANT
KIT TURNOVER KIT B (KITS) ×3 IMPLANT
NDL HYPO 25GX1X1/2 BEV (NEEDLE) ×1 IMPLANT
NEEDLE HYPO 25GX1X1/2 BEV (NEEDLE) ×3 IMPLANT
NS IRRIG 1000ML POUR BTL (IV SOLUTION) ×3 IMPLANT
PACK GENERAL/GYN (CUSTOM PROCEDURE TRAY) ×3 IMPLANT
PAD ARMBOARD 7.5X6 YLW CONV (MISCELLANEOUS) ×6 IMPLANT
PAD DRESSING TELFA 3X8 NADH (GAUZE/BANDAGES/DRESSINGS) ×2 IMPLANT
PAD NEG PRESSURE SENSATRAC (MISCELLANEOUS) IMPLANT
SET HNDPC FAN SPRY TIP SCT (DISPOSABLE) IMPLANT
STAPLER VISISTAT 35W (STAPLE) ×3 IMPLANT
SURGILUBE 2OZ TUBE FLIPTOP (MISCELLANEOUS) ×2 IMPLANT
SUT CHROMIC 4 0 PS 2 18 (SUTURE) ×2 IMPLANT
SUT SILK 4 0 PS 2 (SUTURE) IMPLANT
SUT VIC AB 5-0 PS2 18 (SUTURE) ×4 IMPLANT
SYR CONTROL 10ML LL (SYRINGE) ×3 IMPLANT
TOWEL GREEN STERILE (TOWEL DISPOSABLE) ×3 IMPLANT
TOWEL GREEN STERILE FF (TOWEL DISPOSABLE) ×3 IMPLANT
UNDERPAD 30X36 HEAVY ABSORB (UNDERPADS AND DIAPERS) ×3 IMPLANT

## 2021-02-05 NOTE — Op Note (Signed)
DATE OF OPERATION: 02/05/2021  LOCATION: [facility] In[facility]  PREOPERATIVE DIAGNOSIS: right leg wound  POSTOPERATIVE DIAGNOSIS: Same  PROCEDURE: right leg wound preparation (7 x 15 cm) for placement of Myriad 10 x 10 cm and VAC placement  SURGEON: Raider Valbuena Sanger Gentry Seeber, DO  ASSISTANT: Roetta Sessions, PA  EBL: none  CONDITION: Stable  COMPLICATIONS: None  INDICATION: The patient, Travis [name], is a 65 y.o. male born on 01-May-1956, is here for treatment of a traumatic wound of the right leg after a dog bite.   PROCEDURE DETAILS:  The patient was seen prior to surgery and marked.  The IV antibiotics were given. The patient was taken to the operating room and given a general anesthetic. A standard time out was performed and all information was confirmed by those in the room. SCD placed on left leg.  The right leg was prepped and draped.  The myriad was partially incorporated.  The decision was made to wait for the incorporation of the material and more granulation tissue.  The 7 x 15 cm wound was irrigated with saline and antibiotic solution.  The myriad sheet 10 x 10 cm was placed and secured with the Vicryl.  The sorbact was applied and the VAC.  There was an excellent seal.  The leg was wrapped with kerlex and an Ace wrap. The patient was allowed to wake up and taken to recovery room in stable condition at the end of the case. The family was notified at the end of the case.   The advanced practice practitioner (APP) assisted throughout the case.  The APP was essential in retraction and counter traction when needed to make the case progress smoothly.  This retraction and assistance made it possible to see the tissue plans for the procedure.  The assistance was needed for blood control, tissue re-approximation and assisted with closure of the incision site.

## 2021-02-05 NOTE — Anesthesia Procedure Notes (Signed)
Procedure Name: LMA Insertion Date/Time: 02/05/2021 3:47 PM Performed by: Aundria Rud, CRNA Pre-anesthesia Checklist: Patient identified, Emergency Drugs available, Suction available and Patient being monitored Patient Re-evaluated:Patient Re-evaluated prior to induction Oxygen Delivery Method: Circle System Utilized Preoxygenation: Pre-oxygenation with 100% oxygen Induction Type: IV induction Ventilation: Mask ventilation without difficulty LMA: LMA inserted LMA Size: 4.0 Number of attempts: 1 Placement Confirmation: positive ETCO2 Tube secured with: Tape Dental Injury: Teeth and Oropharynx as per pre-operative assessment

## 2021-02-05 NOTE — Progress Notes (Addendum)
Nutrition Follow-up  DOCUMENTATION CODES:   Non-severe (moderate) malnutrition in context of chronic illness  INTERVENTION:   -Once diet is resumed:  -Continue Ensure Enlive po BID, each supplement provides 350 kcal and 20 grams of protein  -Continue 30 ml Prosource Plus BID, each supplement provides 100 kcals and 15 grams protein -Continue Juven BID, each packet provides 95 calories, 2.5 grams of protein (collagen), and 9.8 grams of carbohydrate (3 grams sugar); also contains 7 grams of L-arginine and L-glutamine, 300 mg vitamin C, 15 mg vitamin E, 1.2 mcg vitamin B-12, 9.5 mg zinc, 200 mg calcium, and 1.5 g  Calcium Beta-hydroxy-Beta-methylbutyrate to support wound healing  -Continue MVI with minerals daily  NUTRITION DIAGNOSIS:   Moderate Malnutrition related to chronic illness as evidenced by mild fat depletion, mild muscle depletion.  Ongoing  GOAL:   Patient will meet greater than or equal to 90% of their needs  Progressing   MONITOR:   PO intake, Supplement acceptance, Labs, Weight trends, Skin, I & O's  REASON FOR ASSESSMENT:   Consult Assessment of nutrition requirement/status  ASSESSMENT:   65 yo male with a PMH of HTN; ETOH dependence; chronic systolic CHF; and remote h/o ICH presenting with a severe dog bite wound.  6/5 - washout with wound vac placement 6/9 - repeat washout 6/16 - plan for split thickness skin graft  Reviewed I/O's: -160 ml x 24 hours and -13.3 L since admission  UOP: 400 ml x 24 hours  Per chart review, plan for split thickness skin graft today. Pt NPO for procedure.   Pt reports that he continues to have a good appetite. Noted meal completion 75-100%. He is consuming "most" of the food off his his meal trays. He confirms that he has been taking his supplements (Juven, Ensure Enlive, and Prosource Plus). Discussed importance of good meal and supplement intake, especially protein, to help promote wound/ post-operative healing. Pt with no  further questions at this time, but appreciative of RD visit.   No new wt reading since admission to assess.   Medications reviewed and include folic acid, colace, and thiamine.   Labs reviewed: CBGS: 140.   Diet Order:   Diet Order             Diet NPO time specified  Diet effective midnight           Diet - low sodium heart healthy                   EDUCATION NEEDS:   Education needs have been addressed  Skin:  Skin Assessment: Skin Integrity Issues: Skin Integrity Issues:: Wound VAC Wound Vac: rt leg Incisions: R leg, closed  Last BM:  02/05/21  Height:   Ht Readings from Last 1 Encounters:  01/29/21 5\' 10"  (1.778 m)    Weight:   Wt Readings from Last 1 Encounters:  01/29/21 72.6 kg    Ideal Body Weight:  75.5 kg  BMI:  Body mass index is 22.96 kg/m.  Estimated Nutritional Needs:   Kcal:  2000-2200  Protein:  105-120 grams  Fluid:  >2 L    03/31/21, RD, LDN, CDCES Registered Dietitian II Certified Diabetes Care and Education Specialist Please refer to [facility] for RD and/or RD on-call/weekend/after hours pager

## 2021-02-05 NOTE — Plan of Care (Signed)

## 2021-02-05 NOTE — Anesthesia Postprocedure Evaluation (Signed)
Anesthesia Post Note  Patient: Travis [name]  Procedure(s) Performed: DEBRIDEMENT RIGHT LEG WOUND, APPLICATION OF MYRIAD MATRIX (Right: Leg Lower) APPLICATION OF WOUND VAC (Right: Leg Lower)     Patient location during evaluation: PACU Anesthesia Type: General Level of consciousness: awake and alert, patient cooperative and oriented Pain management: pain level controlled Vital Signs Assessment: post-procedure vital signs reviewed and stable Respiratory status: spontaneous breathing, nonlabored ventilation and respiratory function stable Cardiovascular status: blood pressure returned to baseline and stable Postop Assessment: no apparent nausea or vomiting Anesthetic complications: no   No notable events documented.  Last Vitals:  Vitals:   02/05/21 1637 02/05/21 1652  BP: (!) 148/74 (!) 145/79  Pulse:  68  Resp: 14 16  Temp:    SpO2: 100%     Last Pain:  Vitals:   02/05/21 1637  TempSrc:   PainSc: 0-No pain                 Jarrett Albor,E. Alazae Crymes

## 2021-02-05 NOTE — Final Consult Note (Signed)
SIGNOFF PROGRESS NOTE  Eh Sesay  IWP:[id number] DOB: 1956/01/07 DOA: 01/25/2021  Final Recommendations: - Continue losartan 50mg  daily for HFrEF. Recommend cardiology follow up.  - Complete rabies vaccination series with the final dose (after dose given today) on 4/23.  - Medicine consultation service will sign off. Please contact [facility] if we can be of any further assistance to this patient's care.   Brief Narrative: Travis [name] is a 65 y.o. male with a history of chronic HFrEF, remote ICH, HTN, EtOH abuse who presented to the ED 6/5 after being mauled by a pit bull with large wounds to the right leg. Orthopedics was consulted, took the patient for OR washout with wound vac placement on 6/5, repeat washout 6/9, and plans for additional surgery with split thickness skin graft 02/05/2021. Transferred to orthopedics service on 6/10 as they request he remain inpatient until that surgery due to tenuous social situation and demonstrated unreliable follow up. He has completed 5 days of zosyn and has begun rabies vaccination series. Losartan has been started and titrated during this hospitalization. Plan for repeat I&D/STSG on 6/16.   Subjective: Again, no complaints. Denies orthopnea, CP, SOB, leg swelling.  Objective: BP (!) 150/75 (BP Location: Left Arm)   Pulse 65   Temp 98.2 F (36.8 C) (Oral)   Resp 18   Ht 5\' 10"  (1.778 m)   Wt 72.6 kg   SpO2 100%   BMI 22.96 kg/m  Gen: No distress CV: RRR, no MRG, no JVD or edema.   Echocardiogram 02/01/2021: LVEF improved from Jan 2020 to 30-35%, global LV hypokinesis and severe dilatation, mild LVH, normal RV and PASP. Mild-mod MR, IVC normal.    Assessment & Plan: Right leg wounds from dog attack: s/p washout 6/5, 6/9 by Dr. Feb 2020 with wound vac placement.  - Plan to return to OR 6/16 for STSG by plastic surgery - Administered rabies Ig 6/9 and initiated vaccination series 6/9, given again 6/12 and 6/16 (I've ordered for the PM),  and final dose due 6/23.  - High protein diet/supplementation to assist with wound healing  Chronic HFrEF, HTN: Pt not adherent to guideline medical therapy. LVEF 20-25% Jan 2020 now improved to 30-35% w/improvement in mild-mod MR. Appears euvolemic.  - Started losartan, doubt pt would afford/take entresto. BMP is stable and BP remains elevated, increase losartan to 50mg  dose and continue at DC.  - DC'ed beta blocker with +cocaine and bradycardia.  - No indication for diuretic.  Alcohol abuse: No evidence of withdrawal or intoxication.  - Continue folate, thiamine - Moderation counseling provided.   Tobacco use:  - Cessation counseling provided, patch declined.   Cocaine use:  - Cessation counseling provided. Holding BB as above.   Time spent: 25 minutes.  7/23, MD [facility] [email or web address] 02/05/2021, 10:07 AM

## 2021-02-05 NOTE — Progress Notes (Signed)
Patient arrived back to unit from OR alert and oriented. Vitals listed below   02/05/21 1652  Vitals  BP (!) 145/79  BP Location Right Arm  BP Method Automatic  Patient Position (if appropriate) Lying  Pulse Rate 68  Pulse Rate Source Monitor  Resp 16  Level of Consciousness  Level of Consciousness Alert  MEWS COLOR  MEWS Score Color Green  MEWS Score  MEWS Temp 0  MEWS Systolic 0  MEWS Pulse 0  MEWS RR 0  MEWS LOC 0  MEWS Score 0

## 2021-02-05 NOTE — Progress Notes (Signed)
Occupational Therapy Treatment Patient Details Name: Travis [name] MRN: [id number] DOB: 03/09/1956 Today's Date: 02/05/2021    History of present illness Travis [name] is a 65 y.o. male admitted on 01/25/2021 with a dog bite on the right leg (deep wounds) after being mauled by pitt bull; 6/5 I & D R leg, wound vac applied. Repeat I & D, with wound vac change on 6/9.  PMH: HTN; ETOH dependence; chronic systolic CHF. He has not had any f/u since his last hospitalization for CHF and does not take medications. Tentative plans for skin graft 02/05/21.   OT comments  Pt making progress towards OT goals. At this time pt requires supervision for all mobility, including bed mobility and transfers, as well as toileting and LB dressing/bathing. Pt agreeable to ambulate this session, however reported that he did not want to be OOB for long, due to fatigue. Acute OT will continue to follow up to ensure pt is progressing towards OT goals and increased functional mobility.   Follow Up Recommendations  No OT follow up;Supervision - Intermittent    Equipment Recommendations  3 in 1 bedside commode;Tub/shower bench    Recommendations for Other Services      Precautions / Restrictions Precautions Precautions: Fall Precaution Comments: wound VAC Required Braces or Orthoses: Other Brace Other Brace: resting boot at night for RLE- able to donn 01/30/21 Restrictions Weight Bearing Restrictions: Yes RLE Weight Bearing: Non weight bearing Other Position/Activity Restrictions: Per Ortho Trauma: we can weight bear through R knee as pt tolerates       Mobility Bed Mobility Overal bed mobility: Modified Independent                  Transfers Overall transfer level: Needs assistance Equipment used: Rolling walker (2 wheeled) Transfers: Sit to/from Stand Sit to Stand: Supervision         General transfer comment: verbal cues for impulsivity and safety    Balance Overall balance assessment: Mild  deficits observed, not formally tested                                         ADL either performed or assessed with clinical judgement   ADL Overall ADL's : Needs assistance/impaired Eating/Feeding: Independent;Sitting                       Toilet Transfer: Supervision/safety;Ambulation Toilet Transfer Details (indicate cue type and reason): continues to require assistance for wound vac and line managment as well as reminders for NWB in RLE Toileting- Clothing Manipulation and Hygiene: Supervision/safety;Sitting/lateral lean;Sit to/from stand       Functional mobility during ADLs: Supervision/safety;Rolling walker;Cueing for safety General ADL Comments: pt able to transfer/ambulate in room supervision - continues to demonstrate some awareness concerns in regards to wound vac and line management, as well as NWB precaution     Vision   Vision Assessment?: No apparent visual deficits   Perception     Praxis      Cognition Arousal/Alertness: Awake/alert Behavior During Therapy: WFL for tasks assessed/performed Overall Cognitive Status: Within Functional Limits for tasks assessed                                          Exercises     Shoulder Instructions  General Comments VSS on RA    Pertinent Vitals/ Pain       Pain Assessment: 0-10 Pain Score: 5  Pain Location: RLE Pain Descriptors / Indicators: Aching;Sore Pain Intervention(s): Limited activity within patient's tolerance;Repositioned;Monitored during session  Home Living                                          Prior Functioning/Environment              Frequency  Min 2X/week        Progress Toward Goals  OT Goals(current goals can now be found in the care plan section)  Progress towards OT goals: Progressing toward goals  Acute Rehab OT Goals Patient Stated Goal: to go home OT Goal Formulation: With patient Time For Goal  Achievement: 02/14/21 Potential to Achieve Goals: Good ADL Goals Pt Will Perform Grooming: standing;with modified independence Pt Will Perform Lower Body Bathing: with modified independence;sit to/from stand Pt Will Perform Lower Body Dressing: with modified independence;sit to/from stand Pt Will Transfer to Toilet: with modified independence;ambulating;regular height toilet Pt Will Perform Toileting - Clothing Manipulation and hygiene: Independently;sitting/lateral leans  Plan Discharge plan remains appropriate    Co-evaluation                 AM-PAC OT "6 Clicks" Daily Activity     Outcome Measure   Help from another person eating meals?: None Help from another person taking care of personal grooming?: None Help from another person toileting, which includes using toliet, bedpan, or urinal?: A Little Help from another person bathing (including washing, rinsing, drying)?: A Little Help from another person to put on and taking off regular upper body clothing?: None Help from another person to put on and taking off regular lower body clothing?: A Little 6 Click Score: 21    End of Session Equipment Utilized During Treatment: Rolling walker  OT Visit Diagnosis: Other abnormalities of gait and mobility (R26.89);Pain Pain - Right/Left: Right Pain - part of body: Leg   Activity Tolerance Patient tolerated treatment well   Patient Left in chair;with call bell/phone within reach   Nurse Communication Mobility status;Precautions        Time: 9476-5465 OT Time Calculation (min): 14 min  Charges: OT General Charges $OT Visit: 1 Visit OT Treatments $Self Care/Home Management : 8-22 mins  Don Giarrusso H., OTR/L Acute Rehabilitation   Gricel Copen Elane Tamiah Dysart 02/05/2021, 2:09 PM

## 2021-02-05 NOTE — Progress Notes (Signed)
Physical Therapy Treatment Patient Details Name: Travis [name] MRN: [id number] DOB: 1956/06/02 Today's Date: 02/05/2021    History of Present Illness Travis [name] is a 65 y.o. male admitted on 01/25/2021 with a dog bite on the right leg (deep wounds) after being mauled by pitt bull; 6/5 I & D R leg, wound vac applied. Repeat I & D, with wound vac change on 6/9.  Plans for skin graft 02/05/21.  PMH: HTN; ETOH dependence; chronic systolic CHF. He has not had any f/u since his last hospitalization for CHF and does not take medications.    PT Comments    Pt was able to continue gait training into the hallway with RW, doing well with NWB compliance.  We were able to practice stair training simulating home entry (one curb step, a short distance on porch, another curb step).  He required min assist, but this can likely progress to less assist with practice.  PT will continue to follow acutely for safe mobility progression.  He is due in the OR later today for skin graft and is hopeful to go home tomorrow if he is allowed.     Follow Up Recommendations  No PT follow up     Equipment Recommendations  Rolling walker with 5" wheels;Other (comment)    Recommendations for Other Services       Precautions / Restrictions Precautions Precautions: Fall Precaution Comments: wound VAC Required Braces or Orthoses: Other Brace Other Brace: resting boot at night for RLE- able to donn 01/30/21 Restrictions Weight Bearing Restrictions: Yes RLE Weight Bearing: Non weight bearing Other Position/Activity Restrictions: Per Ortho Trauma: we can weight bear through R knee as pt tolerates    Mobility  Bed Mobility Overal bed mobility: Modified Independent                  Transfers Overall transfer level: Needs assistance Equipment used: Rolling walker (2 wheeled) Transfers: Sit to/from Stand Sit to Stand: Supervision         General transfer comment: Supervision for line management and  safety  Ambulation/Gait Ambulation/Gait assistance: Supervision Gait Distance (Feet): 200 Feet Assistive device: Rolling walker (2 wheeled) Gait Pattern/deviations: Step-to pattern (hop to)     General Gait Details: supervision for safety and line management, once he has his home vac unit and line can be kept better out from under his feet he should be up to mod I.   Stairs Stairs: Yes Stairs assistance: Min assist Stair Management: No rails;Forwards;With walker Number of Stairs: 1 General stair comments: practiced curb step simulating home entry.  Pt able to hop up and down with min assist, cues for safe RW use, sequencing and to watch coming down the stairs so that he does not hit right toes on step behind him (recommended he kick his foot out in front, but he just held it higher into flexion at his hip for clearance).   Wheelchair Mobility    Modified Rankin (Stroke Patients Only)       Balance Overall balance assessment: Mild deficits observed, not formally tested (due to NWB status of R foot)                                          Cognition Arousal/Alertness: Awake/alert Behavior During Therapy: WFL for tasks assessed/performed Overall Cognitive Status: Within Functional Limits for tasks assessed  Exercises      General Comments General comments (skin integrity, edema, etc.): VSS on RA      Pertinent Vitals/Pain Pain Assessment: Faces Pain Score: 5  Faces Pain Scale: Hurts little more Pain Location: RLE Pain Descriptors / Indicators: Aching;Sore Pain Intervention(s): Limited activity within patient's tolerance;Monitored during session;Repositioned    Home Living                      Prior Function            PT Goals (current goals can now be found in the care plan section) Acute Rehab PT Goals Patient Stated Goal: to go home Progress towards PT goals: Progressing  toward goals    Frequency    Min 3X/week      PT Plan Current plan remains appropriate    Co-evaluation              AM-PAC PT "6 Clicks" Mobility   Outcome Measure  Help needed turning from your back to your side while in a flat bed without using bedrails?: None Help needed moving from lying on your back to sitting on the side of a flat bed without using bedrails?: None Help needed moving to and from a bed to a chair (including a wheelchair)?: A Little Help needed standing up from a chair using your arms (e.g., wheelchair or bedside chair)?: A Little Help needed to walk in hospital room?: A Little Help needed climbing 3-5 steps with a railing? : A Little 6 Click Score: 20    End of Session Equipment Utilized During Treatment: Gait belt Activity Tolerance: Patient tolerated treatment well Patient left: in bed;with call bell/phone within reach   PT Visit Diagnosis: Unsteadiness on feet (R26.81);Muscle weakness (generalized) (M62.81);Difficulty in walking, not elsewhere classified (R26.2)     Time: 6294-7654 PT Time Calculation (min) (ACUTE ONLY): 21 min  Charges:  $Gait Training: 8-22 mins                     Corinna Capra, PT, DPT  Acute Rehabilitation Ortho Tech Supervisor [phone] pager [phone]) [phone] office

## 2021-02-05 NOTE — Interval H&P Note (Signed)
History and Physical Interval Note:  02/05/2021 2:27 PM  Travis [name]  has presented today for surgery, with the diagnosis of dog bite to right leg.  The various methods of treatment have been discussed with the patient and family. After consideration of risks, benefits and other options for treatment, the patient has consented to  Procedure(s) with comments: Debridement right leg wound (Right) - 1 hour total Application of ACell (Right) APPLICATION OF WOUND VAC (Right) SKIN GRAFT SPLIT THICKNESS (Right) as a surgical intervention.  The patient's history has been reviewed, patient examined, no change in status, stable for surgery.  I have reviewed the patient's chart and labs.  Questions were answered to the patient's satisfaction.     [name] [name]

## 2021-02-05 NOTE — Transfer of Care (Signed)
Immediate Anesthesia Transfer of Care Note  Patient: Travis [name]  Procedure(s) Performed: DEBRIDEMENT RIGHT LEG WOUND, APPLICATION OF MYRIAD MATRIX (Right: Leg Lower) APPLICATION OF WOUND VAC (Right: Leg Lower)  Patient Location: PACU  Anesthesia Type:General  Level of Consciousness: awake, alert , drowsy and patient cooperative  Airway & Oxygen Therapy: Patient Spontanous Breathing  Post-op Assessment: Report given to RN, Post -op Vital signs reviewed and stable and Patient moving all extremities  Post vital signs: Reviewed and stable  Last Vitals:  Vitals Value Taken Time  BP 127/70   Temp    Pulse 65   Resp 12   SpO2 100     Last Pain:  Vitals:   02/05/21 1526  TempSrc: Oral  PainSc: 0-No pain      Patients Stated Pain Goal: 0 (67/89/38 1017)  Complications: No notable events documented.

## 2021-02-05 NOTE — Anesthesia Preprocedure Evaluation (Addendum)
Anesthesia Evaluation  Patient identified by MRN, date of birth, ID band Patient awake    Reviewed: Allergy & Precautions, NPO status , Patient's Chart, lab work & pertinent test results, reviewed documented beta blocker date and time   History of Anesthesia Complications Negative for: history of anesthetic complications  Airway Mallampati: I  TM Distance: >3 FB Neck ROM: Full    Dental  (+) Dental Advisory Given, Poor Dentition, Missing, Loose, Chipped   Pulmonary Current Smoker and Patient abstained from smoking.,    breath sounds clear to auscultation       Cardiovascular hypertension, Pt. on medications and Pt. on home beta blockers (-) angina+CHF Travis [name])   Rhythm:Regular Rate:Normal  02/11/2021 ECHO: EF 30-35%, mod decreased LVF, global hypokinesis, mild-mod MR   Neuro/Psych SAH    GI/Hepatic negative GI ROS, (+)     substance abuse  alcohol use,   Endo/Other  negative endocrine ROS  Renal/GU negative Renal ROS     Musculoskeletal   Abdominal   Peds  Hematology negative hematology ROS (+)   Anesthesia Other Findings   Reproductive/Obstetrics                            Anesthesia Physical Anesthesia Plan  ASA: 3  Anesthesia Plan: General   Post-op Pain Management:    Induction: Intravenous  PONV Risk Score and Plan: 1 and Ondansetron and Dexamethasone  Airway Management Planned: LMA  Additional Equipment: None  Intra-op Plan:   Post-operative Plan:   Informed Consent: I have reviewed the patients History and Physical, chart, labs and discussed the procedure including the risks, benefits and alternatives for the proposed anesthesia with the patient or authorized representative who has indicated his/her understanding and acceptance.     Dental advisory given  Plan Discussed with: CRNA and Surgeon  Anesthesia Plan Comments:        Anesthesia Quick  Evaluation

## 2021-02-06 ENCOUNTER — Encounter (HOSPITAL_COMMUNITY): Payer: Self-pay | Admitting: Plastic Surgery

## 2021-02-06 ENCOUNTER — Other Ambulatory Visit (HOSPITAL_COMMUNITY): Payer: Self-pay

## 2021-02-06 MED ORDER — LOSARTAN POTASSIUM 50 MG PO TABS
50.0000 mg | ORAL_TABLET | Freq: Every day | ORAL | 2 refills | Status: AC
  Filled 2021-02-06 (×2): qty 30, 30d supply, fill #0

## 2021-02-06 NOTE — Progress Notes (Signed)
1 Day Post-Op  Subjective: 65 year old male status post dog bite and multiple debridements in the OR.  Most recent debridement with plastic surgery on 02/05/2021.  He is doing well this AM.  He reports no pain.  He is hopeful to go home today.  No fevers, chills, nausea, vomiting.  Today is his birthday and he is hopeful to be home for this.  Objective: Vital signs in last 24 hours: Temp:  [97.3 F (36.3 C)-98.4 F (36.9 C)] 97.9 F (36.6 C) (06/17 0514) Pulse Rate:  [58-72] 58 (06/17 0514) Resp:  [12-19] 19 (06/17 0514) BP: (127-172)/(67-79) 146/76 (06/17 0514) SpO2:  [98 %-100 %] 100 % (06/17 0514) Last BM Date: 02/04/21  Intake/Output from previous day: 06/16 0701 - 06/17 0700 In: 872.7 [P.O.:240; I.V.:532.7; IV Piggyback:100] Out: 1200 [Urine:1200] Intake/Output this shift: No intake/output data recorded.  General appearance: alert, cooperative, no distress, and thin male Head: Normocephalic, without obvious abnormality, atraumatic Resp: Unlabored breathing Extremities: Right lower extremity with dressing in place.  Wound VAC in place with good seal noted.  No drainage in canister noted.  Minimal pain to palpation.  Dressing is CDI.   Lab Results:  CBC Latest Ref Rng & Units 01/30/2021 01/27/2021 01/26/2021  WBC 4.0 - 10.5 K/uL 7.8 9.5 10.7(H)  Hemoglobin 13.0 - 17.0 g/dL 12.9(L) 12.6(L) 13.9  Hematocrit 39.0 - 52.0 % 39.8 38.9(L) 42.7  Platelets 150 - 400 K/uL 233 205 186    BMET Recent Labs    02/05/21 0107  NA 138  K 4.0  CL 102  CO2 29  GLUCOSE 102*  BUN 19  CREATININE 0.78  CALCIUM 9.3   PT/INR No results for input(s): LABPROT, INR in the last 72 hours. ABG No results for input(s): PHART, HCO3 in the last 72 hours.  Invalid input(s): PCO2, PO2  Studies/Results: No results found.  Anti-infectives: Anti-infectives (From admission, onward)    Start     Dose/Rate Route Frequency Ordered Stop   02/05/21 1604  ceFAZolin 1 g / gentamicin 80 mg in NS 500 mL  surgical irrigation  Status:  Discontinued          As needed 02/05/21 1605 02/05/21 1620   02/05/21 1600  ceFAZolin (ANCEF) IVPB 2g/100 mL premix        2 g 200 mL/hr over 30 Minutes Intravenous On call to O.R. 02/05/21 1522 02/05/21 1555   02/05/21 1523  ceFAZolin (ANCEF) 2-4 GM/100ML-% IVPB       Note to Pharmacy: Renda Rolls   : cabinet override      02/05/21 1523 02/05/21 1614   01/26/21 0100  piperacillin-tazobactam (ZOSYN) IVPB 3.375 g        3.375 g 12.5 mL/hr over 240 Minutes Intravenous Every 8 hours 01/25/21 1832 01/30/21 2359   01/25/21 1930  piperacillin-tazobactam (ZOSYN) IVPB 3.375 g        3.375 g 100 mL/hr over 30 Minutes Intravenous  Once 01/25/21 1832 01/25/21 2053   01/25/21 1130  Ampicillin-Sulbactam (UNASYN) 3 g in sodium chloride 0.9 % 100 mL IVPB        3 g 200 mL/hr over 30 Minutes Intravenous  Once 01/25/21 1130 01/25/21 1325       Assessment/Plan: s/p Procedure(s): DEBRIDEMENT RIGHT LEG WOUND, APPLICATION OF MYRIAD MATRIX APPLICATION OF WOUND VAC  Patient is stable for discharge to home if he is able to have home health set up for assistance with wound VAC changes 2 times per week.  Initial change will be necessary  on 02/10/2021.  Discussed discharge plan with orthopedics who is in agreement with discharge Patient to follow-up in plastic surgery office with Dr. Marla Roe or myself in 2 weeks for reevaluation.  Patient is stable today and doing well.  He has no pain to his right lower extremity.  He is hopeful to go home today.  Case management consulted this a.m. for assistance with confirming home health placement.    LOS: 12 days    Charlies Constable, PA-C 02/06/2021

## 2021-02-06 NOTE — TOC Transition Note (Signed)
Transition of Care [facility]) - CM/SW Discharge Note   Patient Details  Name: Imanol Bihl MRN: [id number] Date of Birth: July 03, 1956  Transition of Care [facility]) CM/SW Contact:  Lockie Pares, RN Phone Number: 02/06/2021, 9:54 AM   Clinical Narrative:     Spoke to patient about discharge plans. He stated he has all necessary DME. He will DC today will home vac (  in the room already). RN will be coming from [facility]. Confirming this with them. Made appointment for PCP     Barriers to Discharge: No Barriers Identified   Patient Goals and CMS Choice   CMS Medicare.gov Compare Post Acute Care list provided to:: Patient Choice offered to / list presented to : Patient  Discharge Placement                       Discharge Plan and Services   Discharge Planning Services: CM Consult Post Acute Care Choice: Home Health, Durable Medical Equipment          DME Arranged: Walker rolling DME Agency: [employer]       HH Arranged: RN HH Agency: [facility] Date HH Agency Contacted: 02/06/21 Time HH Agency Contacted: [phone] Representative spoke with at [facility] Agency: Misty Stanley  Social Determinants of Health (SDOH) Interventions     Readmission Risk Interventions No flowsheet data found.

## 2021-02-06 NOTE — Plan of Care (Signed)

## 2021-02-06 NOTE — Plan of Care (Signed)
  Problem: Education: Goal: Knowledge of General Education information will improve Description: Including pain rating scale, medication(s)/side effects and non-pharmacologic comfort measures 02/06/2021 1408 by Sandford Craze, RN Outcome: Adequate for Discharge 02/06/2021 1114 by Sandford Craze, RN Outcome: Adequate for Discharge   Problem: Health Behavior/Discharge Planning: Goal: Ability to manage health-related needs will improve 02/06/2021 1408 by Sandford Craze, RN Outcome: Adequate for Discharge 02/06/2021 1114 by Sandford Craze, RN Outcome: Adequate for Discharge   Problem: Clinical Measurements: Goal: Ability to maintain clinical measurements within normal limits will improve 02/06/2021 1408 by Sandford Craze, RN Outcome: Adequate for Discharge 02/06/2021 1114 by Sandford Craze, RN Outcome: Adequate for Discharge Goal: Will remain free from infection 02/06/2021 1408 by Sandford Craze, RN Outcome: Adequate for Discharge 02/06/2021 1114 by Sandford Craze, RN Outcome: Adequate for Discharge Goal: Diagnostic test results will improve 02/06/2021 1408 by Sandford Craze, RN Outcome: Adequate for Discharge 02/06/2021 1114 by Sandford Craze, RN Outcome: Adequate for Discharge Goal: Respiratory complications will improve 02/06/2021 1408 by Sandford Craze, RN Outcome: Adequate for Discharge 02/06/2021 1114 by Sandford Craze, RN Outcome: Adequate for Discharge Goal: Cardiovascular complication will be avoided 02/06/2021 1408 by Sandford Craze, RN Outcome: Adequate for Discharge 02/06/2021 1114 by Sandford Craze, RN Outcome: Adequate for Discharge   Problem: Activity: Goal: Risk for activity intolerance will decrease 02/06/2021 1408 by Sandford Craze, RN Outcome: Adequate for Discharge 02/06/2021 1114 by Sandford Craze, RN Outcome: Adequate for Discharge   Problem: Nutrition: Goal: Adequate nutrition will be maintained 02/06/2021 1408 by Sandford Craze, RN Outcome:  Adequate for Discharge 02/06/2021 1114 by Sandford Craze, RN Outcome: Adequate for Discharge   Problem: Coping: Goal: Level of anxiety will decrease 02/06/2021 1408 by Sandford Craze, RN Outcome: Adequate for Discharge 02/06/2021 1114 by Sandford Craze, RN Outcome: Adequate for Discharge   Problem: Elimination: Goal: Will not experience complications related to bowel motility 02/06/2021 1408 by Sandford Craze, RN Outcome: Adequate for Discharge 02/06/2021 1114 by Sandford Craze, RN Outcome: Adequate for Discharge Goal: Will not experience complications related to urinary retention 02/06/2021 1408 by Sandford Craze, RN Outcome: Adequate for Discharge 02/06/2021 1114 by Sandford Craze, RN Outcome: Adequate for Discharge   Problem: Pain Managment: Goal: General experience of comfort will improve 02/06/2021 1408 by Sandford Craze, RN Outcome: Adequate for Discharge 02/06/2021 1114 by Sandford Craze, RN Outcome: Adequate for Discharge   Problem: Safety: Goal: Ability to remain free from injury will improve 02/06/2021 1408 by Sandford Craze, RN Outcome: Adequate for Discharge 02/06/2021 1114 by Sandford Craze, RN Outcome: Adequate for Discharge   Problem: Skin Integrity: Goal: Risk for impaired skin integrity will decrease 02/06/2021 1408 by Sandford Craze, RN Outcome: Adequate for Discharge 02/06/2021 1114 by Sandford Craze, RN Outcome: Adequate for Discharge
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
## 2021-02-12 ENCOUNTER — Other Ambulatory Visit (HOSPITAL_COMMUNITY): Payer: Self-pay

## 2021-02-20 ENCOUNTER — Encounter: Payer: Self-pay | Admitting: Plastic Surgery

## 2021-02-20 ENCOUNTER — Other Ambulatory Visit: Payer: Self-pay

## 2021-02-20 ENCOUNTER — Ambulatory Visit (INDEPENDENT_AMBULATORY_CARE_PROVIDER_SITE_OTHER): Payer: Medicare Other | Admitting: Plastic Surgery

## 2021-02-20 VITALS — BP 137/82 | HR 99

## 2021-02-20 DIAGNOSIS — S81801A Unspecified open wound, right lower leg, initial encounter: Secondary | ICD-10-CM

## 2021-02-20 NOTE — Progress Notes (Signed)
   Subjective:    Patient ID: Travis [name], male    DOB: September 14, 1955, 65 y.o.   MRN: [id number]  The patient is a 65 year old male here for follow-up on his right leg wound.  He was bitten by a dog.  He underwent debridement with placement of myriad in general and.  He has done extremely well and filled in the area.  He is ready for a skin graft.  No sign of infection.  He is still smoking.  He has been doing the [facility] dressing changes with home health at home.  He has been tolerating that very well.     Review of Systems  Constitutional:  Positive for activity change.  Eyes: Negative.   Respiratory: Negative.  Negative for chest tightness and shortness of breath.   Cardiovascular:  Positive for leg swelling.  Gastrointestinal: Negative.   Endocrine: Negative.   Genitourinary: Negative.   Musculoskeletal: Negative.   Skin:  Positive for wound.  Hematological: Negative.       Objective:   Physical Exam Vitals and nursing note reviewed.  Constitutional:      Appearance: Normal appearance.  HENT:     Head: Normocephalic and atraumatic.  Cardiovascular:     Rate and Rhythm: Normal rate.     Pulses: Normal pulses.  Pulmonary:     Effort: Pulmonary effort is normal.  Musculoskeletal:        General: Swelling, tenderness and deformity present.  Skin:    General: Skin is warm.     Capillary Refill: Capillary refill takes less than 2 seconds.     Coloration: Skin is not jaundiced or pale.     Findings: Bruising and lesion present.  Neurological:     Mental Status: He is alert. Mental status is at baseline.       Assessment & Plan:     ICD-10-CM   1. Wound of right lower extremity, initial encounter  S81.801A       Plan for split-thickness skin graft to right leg with myriad on the donor site.

## 2021-02-27 ENCOUNTER — Other Ambulatory Visit: Payer: Self-pay

## 2021-02-27 ENCOUNTER — Ambulatory Visit (INDEPENDENT_AMBULATORY_CARE_PROVIDER_SITE_OTHER): Payer: Medicare Other | Admitting: Surgical

## 2021-02-27 ENCOUNTER — Encounter: Payer: Self-pay | Admitting: Surgical

## 2021-02-27 DIAGNOSIS — S81801A Unspecified open wound, right lower leg, initial encounter: Secondary | ICD-10-CM

## 2021-02-27 MED ORDER — ONDANSETRON HCL 4 MG PO TABS: 4.0000 mg | ORAL_TABLET | Freq: Three times a day (TID) | ORAL | 0 refills | Status: AC | PRN

## 2021-02-27 MED ORDER — CEPHALEXIN 500 MG PO CAPS: 500.0000 mg | ORAL_CAPSULE | Freq: Four times a day (QID) | ORAL | 0 refills | Status: AC

## 2021-02-27 MED ORDER — HYDROCODONE-ACETAMINOPHEN 5-325 MG PO TABS: 1.0000 | ORAL_TABLET | Freq: Four times a day (QID) | ORAL | 0 refills | Status: AC | PRN
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
## 2021-03-03 ENCOUNTER — Encounter (HOSPITAL_COMMUNITY): Payer: Self-pay | Admitting: Plastic Surgery

## 2021-03-03 NOTE — Progress Notes (Signed)
DUE TO COVID-19 ONLY ONE VISITOR IS ALLOWED TO COME WITH YOU AND STAY IN THE WAITING ROOM ONLY DURING PRE OP AND PROCEDURE DAY OF SURGERY.   PCP - none Cardiologist - n/a  Chest x-ray - n/a EKG - 01/26/21 Stress Test - n/a ECHO - 02/01/21 Cardiac Cath - n/a  Sleep Study -  n/a CPAP - none  Anesthesia review: Yes  STOP now taking any Aspirin (unless otherwise instructed by your surgeon), Aleve, Naproxen, Ibuprofen, Motrin, Advil, Goody's, BC's, all herbal medications, fish oil, and all vitamins.   Coronavirus Screening Covid test n/a - Ambulatory Surgery  Do you have any of the following symptoms:  Cough yes/no: No Fever (>100.18F)  yes/no: No Runny nose yes/no: No Sore throat yes/no: No Difficulty breathing/shortness of breath  yes/no: No  Have you traveled in the last 14 days and where? yes/no: No  Patient verbalized understanding of instructions that were given via phone.
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
## 2021-03-04 NOTE — Anesthesia Preprocedure Evaluation (Addendum)
Anesthesia Evaluation  Patient identified by MRN, date of birth, ID band Patient awake    Reviewed: Allergy & Precautions, NPO status , Patient's Chart, lab work & pertinent test results  History of Anesthesia Complications Negative for: history of anesthetic complications  Airway Mallampati: II  TM Distance: >3 FB Neck ROM: Full    Dental  (+) Dental Advisory Given, Missing, Poor Dentition   Pulmonary Current SmokerPatient did not abstain from smoking.,    Pulmonary exam normal        Cardiovascular hypertension, Pt. on medications +CHF  Normal cardiovascular exam+ Valvular Problems/Murmurs MR    '22 TTE - EF 30 to 35%. Global hypokinesis. The left ventricular internal cavity size was severely dilated. There is mild left ventricular hypertrophy. Mild to moderate mitral valve regurgitation.  Mild aortic valve sclerosis is present, with no evidence of aortic valve stenosis.     Neuro/Psych  Hx SDH/SAH  negative psych ROS   GI/Hepatic negative GI ROS, (+)     substance abuse  alcohol use and cocaine use,   Endo/Other  negative endocrine ROS  Renal/GU negative Renal ROS     Musculoskeletal negative musculoskeletal ROS (+)   Abdominal   Peds  Hematology negative hematology ROS (+)   Anesthesia Other Findings   Reproductive/Obstetrics                           Anesthesia Physical Anesthesia Plan  ASA: 3  Anesthesia Plan: General   Post-op Pain Management:    Induction: Intravenous  PONV Risk Score and Plan: 1 and Treatment may vary due to age or medical condition, Ondansetron and Dexamethasone  Airway Management Planned: LMA  Additional Equipment: None  Intra-op Plan:   Post-operative Plan: Extubation in OR  Informed Consent: I have reviewed the patients History and Physical, chart, labs and discussed the procedure including the risks, benefits and alternatives for the  proposed anesthesia with the patient or authorized representative who has indicated his/her understanding and acceptance.     Dental advisory given  Plan Discussed with: CRNA and Anesthesiologist  Anesthesia Plan Comments:       Anesthesia Quick Evaluation

## 2021-03-05 ENCOUNTER — Encounter (HOSPITAL_COMMUNITY): Payer: Self-pay | Admitting: Plastic Surgery

## 2021-03-05 ENCOUNTER — Observation Stay (HOSPITAL_COMMUNITY)
Admission: RE | Admit: 2021-03-05 | Discharge: 2021-03-06 | Disposition: A | Discharge status: Home / Self Care | Payer: Medicare Other | Attending: Plastic Surgery | Admitting: Plastic Surgery

## 2021-03-05 ENCOUNTER — Ambulatory Visit (HOSPITAL_COMMUNITY): Payer: Medicare Other | Admitting: Vascular Surgery

## 2021-03-05 ENCOUNTER — Other Ambulatory Visit: Payer: Self-pay

## 2021-03-05 ENCOUNTER — Encounter (HOSPITAL_COMMUNITY)
Admission: RE | Disposition: A | Discharge status: Home / Self Care | Payer: Self-pay | Source: Home / Self Care | Attending: Plastic Surgery

## 2021-03-05 DIAGNOSIS — I110 Hypertensive heart disease with heart failure: Secondary | ICD-10-CM | POA: Diagnosis not present

## 2021-03-05 DIAGNOSIS — Z8249 Family history of ischemic heart disease and other diseases of the circulatory system: Secondary | ICD-10-CM | POA: Insufficient documentation

## 2021-03-05 DIAGNOSIS — S81801A Unspecified open wound, right lower leg, initial encounter: Secondary | ICD-10-CM | POA: Diagnosis present

## 2021-03-05 DIAGNOSIS — I509 Heart failure, unspecified: Secondary | ICD-10-CM | POA: Insufficient documentation

## 2021-03-05 DIAGNOSIS — F17210 Nicotine dependence, cigarettes, uncomplicated: Secondary | ICD-10-CM | POA: Diagnosis not present

## 2021-03-05 DIAGNOSIS — W540XXA Bitten by dog, initial encounter: Secondary | ICD-10-CM | POA: Diagnosis not present

## 2021-03-05 HISTORY — PX: SKIN SPLIT GRAFT: SHX444

## 2021-03-05 LAB — CREATININE, SERUM
Creatinine, Ser: 0.75 mg/dL (ref 0.61–1.24)
GFR, Estimated: >60 mL/min (ref >60)

## 2021-03-05 SURGERY — APPLICATION, GRAFT, SKIN, SPLIT-THICKNESS
Anesthesia: General | Site: Leg Lower | Laterality: Right

## 2021-03-05 MED ORDER — OXYCODONE HCL 5 MG PO TABS: 5.0000 mg | ORAL_TABLET | Freq: Once | ORAL | Status: DC | PRN

## 2021-03-05 MED ORDER — CEFAZOLIN SODIUM-DEXTROSE 2-4 GM/100ML-% IV SOLN
2.0000 g | INTRAVENOUS | Status: AC
  Administered 2021-03-05: 2 g via INTRAVENOUS
  Filled 2021-03-05: qty 100

## 2021-03-05 MED ORDER — CEFAZOLIN SODIUM-DEXTROSE 2-4 GM/100ML-% IV SOLN
2.0000 g | Freq: Three times a day (TID) | INTRAVENOUS | Status: DC
  Administered 2021-03-05 – 2021-03-06 (×2): 2 g via INTRAVENOUS
  Filled 2021-03-05 (×4): qty 100

## 2021-03-05 MED ORDER — OXYCODONE HCL 5 MG PO TABS: 5.0000 mg | ORAL_TABLET | ORAL | Status: DC | PRN

## 2021-03-05 MED ORDER — DIPHENHYDRAMINE HCL 12.5 MG/5ML PO ELIX: 12.5000 mg | ORAL_SOLUTION | Freq: Four times a day (QID) | ORAL | Status: DC | PRN

## 2021-03-05 MED ORDER — SENNA 8.6 MG PO TABS
1.0000 | ORAL_TABLET | Freq: Two times a day (BID) | ORAL | Status: DC
  Administered 2021-03-05 – 2021-03-06 (×2): 8.6 mg via ORAL
  Filled 2021-03-05 (×2): qty 1

## 2021-03-05 MED ORDER — DEXAMETHASONE SODIUM PHOSPHATE 10 MG/ML IJ SOLN
INTRAMUSCULAR | Status: AC
  Filled 2021-03-05: qty 1

## 2021-03-05 MED ORDER — MORPHINE SULFATE (PF) 2 MG/ML IV SOLN: 2.0000 mg | INTRAVENOUS | Status: DC | PRN

## 2021-03-05 MED ORDER — PROPOFOL 10 MG/ML IV BOLUS
INTRAVENOUS | Status: DC | PRN
  Administered 2021-03-05: 10 mg via INTRAVENOUS
  Administered 2021-03-05: 20 mg via INTRAVENOUS
  Administered 2021-03-05: 120 mg via INTRAVENOUS

## 2021-03-05 MED ORDER — ONDANSETRON HCL 4 MG/2ML IJ SOLN: 4.0000 mg | Freq: Four times a day (QID) | INTRAMUSCULAR | Status: DC | PRN

## 2021-03-05 MED ORDER — LACTATED RINGERS IV SOLN: INTRAVENOUS | Status: DC

## 2021-03-05 MED ORDER — SODIUM CHLORIDE 0.9% FLUSH: 3.0000 mL | Freq: Two times a day (BID) | INTRAVENOUS | Status: DC

## 2021-03-05 MED ORDER — FENTANYL CITRATE (PF) 250 MCG/5ML IJ SOLN
INTRAMUSCULAR | Status: AC
  Filled 2021-03-05: qty 5

## 2021-03-05 MED ORDER — 0.9 % SODIUM CHLORIDE (POUR BTL) OPTIME
TOPICAL | Status: DC | PRN
  Administered 2021-03-05: 1000 mL

## 2021-03-05 MED ORDER — DIPHENHYDRAMINE HCL 50 MG/ML IJ SOLN: 12.5000 mg | Freq: Four times a day (QID) | INTRAMUSCULAR | Status: DC | PRN

## 2021-03-05 MED ORDER — FENTANYL CITRATE (PF) 100 MCG/2ML IJ SOLN: 25.0000 ug | INTRAMUSCULAR | Status: DC | PRN

## 2021-03-05 MED ORDER — CHLORHEXIDINE GLUCONATE 0.12 % MT SOLN
OROMUCOSAL | Status: AC
  Administered 2021-03-05: 15 mL via OROMUCOSAL
  Filled 2021-03-05: qty 15

## 2021-03-05 MED ORDER — IBUPROFEN 400 MG PO TABS
400.0000 mg | ORAL_TABLET | Freq: Four times a day (QID) | ORAL | Status: DC
  Administered 2021-03-05 – 2021-03-06 (×3): 400 mg via ORAL
  Filled 2021-03-05 (×3): qty 1

## 2021-03-05 MED ORDER — ONDANSETRON HCL 4 MG/2ML IJ SOLN
INTRAMUSCULAR | Status: AC
  Filled 2021-03-05: qty 2

## 2021-03-05 MED ORDER — PHENYLEPHRINE 40 MCG/ML (10ML) SYRINGE FOR IV PUSH (FOR BLOOD PRESSURE SUPPORT)
PREFILLED_SYRINGE | INTRAVENOUS | Status: DC | PRN
  Administered 2021-03-05 (×6): 80 ug via INTRAVENOUS

## 2021-03-05 MED ORDER — HYDROCODONE-ACETAMINOPHEN 5-325 MG PO TABS
1.0000 | ORAL_TABLET | ORAL | Status: DC | PRN
Start: 2021-03-05 — End: 2021-03-06

## 2021-03-05 MED ORDER — BUPIVACAINE-EPINEPHRINE (PF) 0.25% -1:200000 IJ SOLN
INTRAMUSCULAR | Status: AC
  Filled 2021-03-05: qty 30

## 2021-03-05 MED ORDER — BUPIVACAINE-EPINEPHRINE 0.25% -1:200000 IJ SOLN
INTRAMUSCULAR | Status: DC | PRN
  Administered 2021-03-05: 20 mL

## 2021-03-05 MED ORDER — PROPOFOL 10 MG/ML IV BOLUS
INTRAVENOUS | Status: AC
  Filled 2021-03-05: qty 20

## 2021-03-05 MED ORDER — ONDANSETRON HCL 4 MG/2ML IJ SOLN
INTRAMUSCULAR | Status: DC | PRN
  Administered 2021-03-05: 4 mg via INTRAVENOUS

## 2021-03-05 MED ORDER — SODIUM CHLORIDE 0.9% FLUSH: 3.0000 mL | INTRAVENOUS | Status: DC | PRN

## 2021-03-05 MED ORDER — SODIUM CHLORIDE 0.9 % IV SOLN: 250.0000 mL | INTRAVENOUS | Status: DC | PRN

## 2021-03-05 MED ORDER — DEXAMETHASONE SODIUM PHOSPHATE 10 MG/ML IJ SOLN
INTRAMUSCULAR | Status: DC | PRN
  Administered 2021-03-05: 5 mg via INTRAVENOUS

## 2021-03-05 MED ORDER — OXYCODONE HCL 5 MG/5ML PO SOLN: 5.0000 mg | Freq: Once | ORAL | Status: DC | PRN

## 2021-03-05 MED ORDER — ONDANSETRON HCL 4 MG/2ML IJ SOLN: 4.0000 mg | Freq: Once | INTRAMUSCULAR | Status: DC | PRN

## 2021-03-05 MED ORDER — ONDANSETRON 4 MG PO TBDP
4.0000 mg | ORAL_TABLET | Freq: Four times a day (QID) | ORAL | Status: DC | PRN
Start: 2021-03-05 — End: 2021-03-06

## 2021-03-05 MED ORDER — ORAL CARE MOUTH RINSE: 15.0000 mL | Freq: Once | OROMUCOSAL | Status: AC

## 2021-03-05 MED ORDER — LIDOCAINE 2% (20 MG/ML) 5 ML SYRINGE
INTRAMUSCULAR | Status: AC
  Filled 2021-03-05: qty 10

## 2021-03-05 MED ORDER — ZOLPIDEM TARTRATE 5 MG PO TABS: 5.0000 mg | ORAL_TABLET | Freq: Every evening | ORAL | Status: DC | PRN

## 2021-03-05 MED ORDER — LIDOCAINE 2% (20 MG/ML) 5 ML SYRINGE
INTRAMUSCULAR | Status: DC | PRN
  Administered 2021-03-05: 100 mg via INTRAVENOUS

## 2021-03-05 MED ORDER — ACETAMINOPHEN 325 MG PO TABS: 650.0000 mg | ORAL_TABLET | ORAL | Status: DC | PRN

## 2021-03-05 MED ORDER — MIDAZOLAM HCL 2 MG/2ML IJ SOLN
INTRAMUSCULAR | Status: AC
  Filled 2021-03-05: qty 2

## 2021-03-05 MED ORDER — CHLORHEXIDINE GLUCONATE 0.12 % MT SOLN: 15.0000 mL | Freq: Once | OROMUCOSAL | Status: AC

## 2021-03-05 MED ORDER — KCL IN DEXTROSE-NACL 20-5-0.45 MEQ/L-%-% IV SOLN
INTRAVENOUS | Status: DC
  Filled 2021-03-05: qty 1000

## 2021-03-05 MED ORDER — MIDAZOLAM HCL 2 MG/2ML IJ SOLN
INTRAMUSCULAR | Status: DC | PRN
  Administered 2021-03-05: 2 mg via INTRAVENOUS

## 2021-03-05 MED ORDER — ACETAMINOPHEN 650 MG RE SUPP: 650.0000 mg | RECTAL | Status: DC | PRN

## 2021-03-05 MED ORDER — ACETAMINOPHEN 325 MG PO TABS
325.0000 mg | ORAL_TABLET | Freq: Four times a day (QID) | ORAL | Status: DC
  Administered 2021-03-06 (×2): 325 mg via ORAL
  Filled 2021-03-05 (×2): qty 1

## 2021-03-05 MED ORDER — PROPOFOL 1000 MG/100ML IV EMUL
INTRAVENOUS | Status: AC
  Filled 2021-03-05: qty 100

## 2021-03-05 MED ORDER — CHLORHEXIDINE GLUCONATE CLOTH 2 % EX PADS: 6.0000 | MEDICATED_PAD | Freq: Once | CUTANEOUS | Status: DC

## 2021-03-05 MED ORDER — FENTANYL CITRATE (PF) 250 MCG/5ML IJ SOLN
INTRAMUSCULAR | Status: DC | PRN
  Administered 2021-03-05 (×2): 100 ug via INTRAVENOUS

## 2021-03-05 SURGICAL SUPPLY — 56 items
BALL CTTN LRG ABS STRL LF (GAUZE/BANDAGES/DRESSINGS)
BLADE CLIPPER SURG (BLADE) IMPLANT
BLADE DERMATOME SS (BLADE) ×3 IMPLANT
BNDG CMPR MED 10X6 ELC LF (GAUZE/BANDAGES/DRESSINGS) ×1
BNDG ELASTIC 4X5.8 VLCR STR LF (GAUZE/BANDAGES/DRESSINGS) IMPLANT
BNDG ELASTIC 6X10 VLCR STRL LF (GAUZE/BANDAGES/DRESSINGS) ×1 IMPLANT
BNDG ELASTIC 6X5.8 VLCR STR LF (GAUZE/BANDAGES/DRESSINGS) IMPLANT
BNDG GAUZE ELAST 4 BULKY (GAUZE/BANDAGES/DRESSINGS) ×1 IMPLANT
CANISTER SUCT 3000ML PPV (MISCELLANEOUS) IMPLANT
CANISTER WOUND CARE 500ML ATS (WOUND CARE) IMPLANT
COTTONBALL LRG STERILE PKG (GAUZE/BANDAGES/DRESSINGS) IMPLANT
COVER SURGICAL LIGHT HANDLE (MISCELLANEOUS) ×2 IMPLANT
DERMACARRIERS GRAFT 1 TO 1.5 (DISPOSABLE) ×2
DRAPE DERMATAC (DRAPES) ×1 IMPLANT
DRAPE HALF SHEET 40X57 (DRAPES) ×2 IMPLANT
DRAPE INCISE IOBAN 66X45 STRL (DRAPES) ×2 IMPLANT
DRAPE ORTHO SPLIT 77X108 STRL (DRAPES) ×4
DRAPE SURG ORHT 6 SPLT 77X108 (DRAPES) ×2 IMPLANT
DRESSING HYDROCOLLOID 4X4 (GAUZE/BANDAGES/DRESSINGS) IMPLANT
DRSG CUTIMED SORBACT 7X9 (GAUZE/BANDAGES/DRESSINGS) ×1 IMPLANT
DRSG OPSITE 6X11 MED (GAUZE/BANDAGES/DRESSINGS) IMPLANT
DRSG PAD ABDOMINAL 8X10 ST (GAUZE/BANDAGES/DRESSINGS) ×2 IMPLANT
DRSG TELFA 3X8 NADH (GAUZE/BANDAGES/DRESSINGS) ×4 IMPLANT
DRSG VAC ATS LRG SENSATRAC (GAUZE/BANDAGES/DRESSINGS) IMPLANT
DRSG VAC ATS MED SENSATRAC (GAUZE/BANDAGES/DRESSINGS) ×1 IMPLANT
DRSG VAC ATS SM SENSATRAC (GAUZE/BANDAGES/DRESSINGS) IMPLANT
ELECT REM PT RETURN 9FT ADLT (ELECTROSURGICAL)
ELECTRODE REM PT RTRN 9FT ADLT (ELECTROSURGICAL) IMPLANT
FILTER STRAW FLUID ASPIR (MISCELLANEOUS) ×2 IMPLANT
GAUZE SPONGE 4X4 12PLY STRL (GAUZE/BANDAGES/DRESSINGS) ×2 IMPLANT
GAUZE XEROFORM 5X9 LF (GAUZE/BANDAGES/DRESSINGS) ×2 IMPLANT
GEL ULTRASOUND 20GR AQUASONIC (MISCELLANEOUS) IMPLANT
GLOVE SURG ENC MOIS LTX SZ6.5 (GLOVE) ×4 IMPLANT
GOWN STRL REUS W/ TWL LRG LVL3 (GOWN DISPOSABLE) ×2 IMPLANT
GOWN STRL REUS W/TWL LRG LVL3 (GOWN DISPOSABLE) ×4
GRAFT DERMACARRIERS 1 TO 1.5 (DISPOSABLE) ×1 IMPLANT
GRAFT MYRIAD 3 LAYER 10X10 (Graft) ×1 IMPLANT
HANDPIECE INTERPULSE COAX TIP (DISPOSABLE)
KIT BASIN OR (CUSTOM PROCEDURE TRAY) ×2 IMPLANT
KIT TURNOVER KIT B (KITS) ×2 IMPLANT
NDL HYPO 25GX1X1/2 BEV (NEEDLE) ×1 IMPLANT
NEEDLE HYPO 25GX1X1/2 BEV (NEEDLE) ×2 IMPLANT
NS IRRIG 1000ML POUR BTL (IV SOLUTION) ×2 IMPLANT
PACK GENERAL/GYN (CUSTOM PROCEDURE TRAY) ×2 IMPLANT
PAD ARMBOARD 7.5X6 YLW CONV (MISCELLANEOUS) ×4 IMPLANT
PAD DRESSING TELFA 3X8 NADH (GAUZE/BANDAGES/DRESSINGS) ×2 IMPLANT
SET HNDPC FAN SPRY TIP SCT (DISPOSABLE) IMPLANT
STAPLER VISISTAT 35W (STAPLE) ×2 IMPLANT
SURGILUBE 2OZ TUBE FLIPTOP (MISCELLANEOUS) IMPLANT
SUT CHROMIC 4 0 PS 2 18 (SUTURE) IMPLANT
SUT SILK 4 0 PS 2 (SUTURE) IMPLANT
SUT VIC AB 5-0 PS2 18 (SUTURE) ×5 IMPLANT
SYR CONTROL 10ML LL (SYRINGE) ×2 IMPLANT
TOWEL GREEN STERILE (TOWEL DISPOSABLE) ×2 IMPLANT
TOWEL GREEN STERILE FF (TOWEL DISPOSABLE) ×2 IMPLANT
UNDERPAD 30X36 HEAVY ABSORB (UNDERPADS AND DIAPERS) ×2 IMPLANT

## 2021-03-05 NOTE — Interval H&P Note (Signed)
History and Physical Interval Note:  03/05/2021 2:38 PM  Travis [name]  has presented today for surgery, with the diagnosis of Wound of right lower extremity.  The various methods of treatment have been discussed with the patient and family. After consideration of risks, benefits and other options for treatment, the patient has consented to  Procedure(s) with comments: Right leg split-thickness skin graft (Right) - 1 hour total Application of Myriad (Right) as a surgical intervention.  The patient's history has been reviewed, patient examined, no change in status, stable for surgery.  I have reviewed the patient's chart and labs.  Questions were answered to the patient's satisfaction.     [name] [name]

## 2021-03-05 NOTE — Plan of Care (Signed)

## 2021-03-05 NOTE — Anesthesia Postprocedure Evaluation (Signed)
Anesthesia Post Note  Patient: Travis [name]  Procedure(s) Performed: Right leg split-thickness skin graft (Right: Leg Lower) Application of Myriad (Right: Leg Lower)     Patient location during evaluation: PACU Anesthesia Type: General Level of consciousness: sedated Pain management: pain level controlled Vital Signs Assessment: post-procedure vital signs reviewed and stable Respiratory status: spontaneous breathing and respiratory function stable Cardiovascular status: stable Postop Assessment: no apparent nausea or vomiting Anesthetic complications: no   No notable events documented.  Last Vitals:  Vitals:   03/05/21 1747 03/05/21 1802  BP: (!) 159/82 (!) 166/92  Pulse: 79 65  Resp: 11 15  Temp:    SpO2: 100% 100%    Last Pain:  Vitals:   03/05/21 1802  TempSrc:   PainSc: 0-No pain                 Kammy Klett DANIEL

## 2021-03-05 NOTE — Progress Notes (Signed)
Pt's s/o listed as his transport after surgery-- pt's s/o would not answer the phone or would answer and hang up immediately. Discharge paperwork could not be given over the phone with someone that will be staying with him tonight. Dillingham put in admit orders for tonight until pt can have someone with him and have discharge info.

## 2021-03-05 NOTE — Op Note (Signed)
DATE OF OPERATION: 03/05/2021  LOCATION: [facility]  PREOPERATIVE DIAGNOSIS: right leg wound  POSTOPERATIVE DIAGNOSIS: Same  PROCEDURE: Split thickness skin graft to right leg 7 x 15 cm with Myriad placement to donor site 10 x 10 cm, VAC placement to right leg  SURGEON: Monetta Lick Sanger Leyli Kevorkian, DO  ASSISTANT: Roetta Sessions, PA  EBL: 2 cc  CONDITION: Stable  COMPLICATIONS: None  INDICATION: The patient, Travis [name], is a 65 y.o. male born on 11/20/1955, is here for treatment of a right leg dog bite wound.   PROCEDURE DETAILS:  The patient was seen prior to surgery and marked.  The IV antibiotics were given. The patient was taken to the operating room and given a general anesthetic. A standard time out was performed and all information was confirmed by those in the room. SCD was placed on the left leg.   The right leg and thigh was prepped and draped.  Local with epinephrine was injected into the right thigh for intraoperative hemostasis and postoperative pain control.  The 7 x 15 cm wound was irrigated with saline.  The dermatome was set at 14/1000 inch.  The donor site was the right thigh.  The donor skin was placed on the right leg wound and secured with the 5-0 Vicryl.  The sorbact was applied and the VAC.  Slits were placed in the graft to prevent a hematoma or fluid build up under the graft. There was an excellent seal.  All of the myriad was applied to the donor site and secured with the 5-0 Vicryl with a sorbact applied over it.  KY gel and sterile dressing was applied.  The right leg was placed in a splint.  The patient was allowed to wake up and taken to recovery room in stable condition at the end of the case. The family was notified at the end of the case.   The advanced practice practitioner (APP) assisted throughout the case.  The APP was essential in retraction and counter traction when needed to make the case progress smoothly.  This retraction and  assistance made it possible to see the tissue plans for the procedure.  The assistance was needed for blood control, tissue re-approximation and assisted with closure of the incision site.

## 2021-03-05 NOTE — Anesthesia Procedure Notes (Signed)
Procedure Name: LMA Insertion Date/Time: 03/05/2021 3:04 PM Performed by: Shireen Quan, CRNA Pre-anesthesia Checklist: Patient identified, Emergency Drugs available, Suction available and Patient being monitored Patient Re-evaluated:Patient Re-evaluated prior to induction Oxygen Delivery Method: Circle System Utilized Preoxygenation: Pre-oxygenation with 100% oxygen Induction Type: IV induction Ventilation: Mask ventilation without difficulty LMA: LMA inserted LMA Size: 5.0 Number of attempts: 1 Placement Confirmation: positive ETCO2 Tube secured with: Tape Dental Injury: Teeth and Oropharynx as per pre-operative assessment

## 2021-03-05 NOTE — Discharge Instructions (Signed)
Do Not remove the VAC on the lower right leg.  This has a skin graft in place.  The surgeon will evaluate it in the office. Right thigh  - change gauze daily starting Saturday and apply KY gel.  Do not get the leg wet.

## 2021-03-05 NOTE — Transfer of Care (Signed)
Immediate Anesthesia Transfer of Care Note  Patient: Travis [name]  Procedure(s) Performed: Right leg split-thickness skin graft (Right: Leg Lower) Application of Myriad (Right: Leg Lower)  Patient Location: PACU  Anesthesia Type:General  Level of Consciousness: drowsy and patient cooperative  Airway & Oxygen Therapy: Patient Spontanous Breathing and Patient connected to nasal cannula oxygen  Post-op Assessment: Report given to RN, Post -op Vital signs reviewed and stable and Patient moving all extremities  Post vital signs: Reviewed and stable  Last Vitals:  Vitals Value Taken Time  BP 175/75 03/05/21 1559  Temp    Pulse 47 03/05/21 1600  Resp 18 03/05/21 1600  SpO2 95 % 03/05/21 1600  Vitals shown include unvalidated device data.  Last Pain:  Vitals:   03/05/21 1322  TempSrc: Oral  PainSc:          Complications: No notable events documented.

## 2021-03-06 ENCOUNTER — Encounter (HOSPITAL_COMMUNITY): Payer: Self-pay | Admitting: Plastic Surgery

## 2021-03-06 DIAGNOSIS — S81801A Unspecified open wound, right lower leg, initial encounter: Secondary | ICD-10-CM | POA: Diagnosis not present

## 2021-03-06 NOTE — Plan of Care (Signed)

## 2021-03-06 NOTE — TOC Initial Note (Signed)
Transition of Care [facility]) - Initial/Assessment Note    Patient Details  Name: Travis [name] MRN: [id number] Date of Birth: 1956-01-28  Transition of Care [facility]) CM/SW Contact:    Kingsley Plan, RN Phone Number: 03/06/2021, 11:56 AM  Clinical Narrative:                 Patient from home with girl friend. Active with [facility] for VAC dressing changes. Nurse has connected home VAC for patient. Misty Stanley with [facility] does not need resumption of care orders since patient in observation status. Lisa aware discharge today.   Patient requesting ambulance transportation home. NCM confirmed address. PT[facility] paperwork placed in drawer. [facility] called for 1230 to 1300 pick up.   Patient states "someone " is at home to let him in and assist.     Expected Discharge Plan: Home w Home Health Services Barriers to Discharge: No Barriers Identified   Patient Goals and CMS Choice Patient states their goals for this hospitalization and ongoing recovery are:: to go home CMS Medicare.gov Compare Post Acute Care list provided to:: Patient Choice offered to / list presented to : Patient  Expected Discharge Plan and Services Expected Discharge Plan: Home w Home Health Services   Discharge Planning Services: CM Consult Post Acute Care Choice: Home Health Living arrangements for the past 2 months: Single Family Home Expected Discharge Date: 03/06/21               DME Arranged: N/A         HH Arranged: RN HH Agency: [facility] Date HH Agency Contacted: 03/06/21 Time HH Agency Contacted: 1155 Representative spoke with at [facility] Agency: Misty Stanley  Prior Living Arrangements/Services Living arrangements for the past 2 months: Single Family Home Lives with:: Significant Other Patient language and need for interpreter reviewed:: Yes        Need for Family Participation in Patient Care: Yes (Comment) Care giver support system in place?: Yes (comment) Current home services: DME Criminal  Activity/Legal Involvement Pertinent to Current Situation/Hospitalization: No - Comment as needed  Activities of Daily Living Home Assistive Devices/Equipment: Eyeglasses, Blood pressure cuff, Walker (specify type) ADL Screening (condition at time of admission) Patient's cognitive ability adequate to safely complete daily activities?: Yes Is the patient deaf or have difficulty hearing?: No Does the patient have difficulty seeing, even when wearing glasses/contacts?: No Does the patient have difficulty concentrating, remembering, or making decisions?: No Patient able to express need for assistance with ADLs?: Yes Does the patient have difficulty dressing or bathing?: Yes Independently performs ADLs?: No Communication: Independent Dressing (OT): Needs assistance Is this a change from baseline?: Pre-admission baseline Grooming: Needs assistance Is this a change from baseline?: Pre-admission baseline Feeding: Independent Bathing: Needs assistance Is this a change from baseline?: Pre-admission baseline Toileting: Needs assistance Is this a change from baseline?: Pre-admission baseline In/Out Bed: Needs assistance Is this a change from baseline?: Pre-admission baseline Walks in Home: Independent with device (comment) Does the patient have difficulty walking or climbing stairs?: Yes Weakness of Legs: Right Weakness of Arms/Hands: None  Permission Sought/Granted   Permission granted to share information with : No              Emotional Assessment Appearance:: Appears stated age Attitude/Demeanor/Rapport: Engaged Affect (typically observed): Accepting Orientation: : Oriented to Self, Oriented to Place, Oriented to  Time, Oriented to Situation   Psych Involvement: No (comment)  Admission diagnosis:  Leg wound, right [S81.801A] Patient Active Problem  List   Diagnosis Date Noted   Leg wound, right 03/05/2021   Wound of right leg    Malnutrition of moderate degree 01/26/2021    Dog bite 01/25/2021   Accelerated hypertension 01/25/2021   Chronic systolic CHF (congestive heart failure) (HCC) 01/25/2021   Alcohol dependence (HCC) 01/25/2021   Tobacco dependence 01/25/2021   Noncompliance with medication regimen 01/25/2021   Acute systolic congestive heart failure (HCC) 08/27/2018   Mitral regurgitation, moderate to severe 08/27/2018   IDA (iron deficiency anemia) 08/27/2018   Transaminitis 08/26/2018   Subarachnoid hemorrhage (HCC) 01/09/2017   Subdural hemorrhage (HCC) 01/09/2017   Skull fracture with cerebral contusion (HCC) 01/07/2017   PCP:  Patient, No Pcp Per (Inactive) Pharmacy:   [facility] - [place], [place] - [address] [place] [address] Phone: [phone] Fax: [phone]  [id number] - [place], [place] - [address] AT [facility] [address] [place] [address] Phone: [phone] Fax: [phone]     Social Determinants of Health (SDOH) Interventions    Readmission Risk Interventions No flowsheet data found.

## 2021-03-09 NOTE — Discharge Summary (Signed)
Physician Discharge Summary  Patient ID: Travis [name] MRN: [id number] DOB/AGE: 1956-02-28 65 y.o.  Admit date: 03/05/2021 Discharge date: 03/09/2021  Admission Diagnoses: Right leg wound  Discharge Diagnoses:  Active Problems:   Leg wound, right   Discharged Condition: good  Hospital Course: Patient is a 65 year old male who presented to the [facility] operating room on 03/05/2021 for split-thickness skin graft to right leg, application of wound VAC to right leg and placement of wound matrix to skin graft donor site with Dr. Ulice Bold.  Patient stayed overnight due to transportation issues, patient was stable with no acute overnight events.  Patient was ready for discharge.  Consults: None  Significant Diagnostic Studies: None  Treatments: IV hydration, antibiotics: Ancef, and analgesia: acetaminophen, Vicodin, and Morphine  Discharge Exam: Blood pressure (!) 153/86, pulse 73, temperature 98.7 F (37.1 C), temperature source Oral, resp. rate 18, height 5\' 10"  (1.778 m), weight 72.6 kg, SpO2 100 %. General appearance: alert, cooperative, and no distress Extremities: Dressings in place over right lower extremity split-thickness skin graft and Wound VAC with good suction. Pulmonary: Normal effort  Disposition: Discharge disposition: 01-Home or Self Care       Discharge Instructions     Call MD for:  difficulty breathing, headache or visual disturbances   Complete by: As directed    Call MD for:  extreme fatigue   Complete by: As directed    Call MD for:  hives   Complete by: As directed    Call MD for:  persistant dizziness or light-headedness   Complete by: As directed    Call MD for:  persistant nausea and vomiting   Complete by: As directed    Call MD for:  redness, tenderness, or signs of infection (pain, swelling, redness, odor or green/yellow discharge around incision site)   Complete by: As directed    Call MD for:  severe uncontrolled pain   Complete by: As  directed    Call MD for:  temperature >100.4   Complete by: As directed    Diet - low sodium heart healthy   Complete by: As directed    Increase activity slowly   Complete by: As directed       Allergies as of 03/06/2021   No Known Allergies      Medication List     TAKE these medications    acetaminophen 500 MG tablet Commonly known as: TYLENOL Take 1,000 mg by mouth every 6 (six) hours as needed for moderate pain.   losartan 50 MG tablet Commonly known as: COZAAR Take 1 tablet (50 mg total) by mouth daily.   ondansetron 4 MG tablet Commonly known as: Zofran Take 1 tablet (4 mg total) by mouth every 8 (eight) hours as needed for nausea or vomiting.   Vitamin C Liqd Take 1 Dose by mouth daily.   Vitamin D3 Liqd Take 1 Dose by mouth daily.       ASK your doctor about these medications    acetaminophen 325 MG tablet Commonly known as: TYLENOL Take 2 tablets (650 mg total) by mouth every 6 (six) hours as needed for mild pain, moderate pain or headache.   feeding supplement Liqd Take 237 mLs by mouth 2 (two) times daily between meals.   methocarbamol 500 MG tablet Commonly known as: ROBAXIN Take 1 tablet (500 mg total) by mouth every 8 (eight) hours as needed for muscle spasms.   multivitamin with minerals Tabs tablet Take 1 tablet by mouth daily.  mupirocin ointment 2 % Commonly known as: BACTROBAN Place 1 application into the nose 2 (two) times daily.   vitamin C 1000 MG tablet Take 1 tablet (1,000 mg total) by mouth daily.   Vitamin D 125 MCG (5000 UT) Caps Take 1 capsule by mouth daily.        Follow-up Information     Dillingham, [name], DO Follow up in 1 week(s).   Specialty: Plastic Surgery Contact information: [address] [place] [address] [phone]                 [facility] [address] [place], [place] [address] [phone]  Signed: Kermit Balo Lileigh Fahringer 03/09/2021, 11:48  AM

## 2021-03-11 ENCOUNTER — Inpatient Hospital Stay (INDEPENDENT_AMBULATORY_CARE_PROVIDER_SITE_OTHER): Payer: Medicaid Other | Admitting: Primary Care

## 2021-03-12 ENCOUNTER — Ambulatory Visit: Payer: Medicaid Other | Admitting: Nurse Practitioner

## 2021-03-13 ENCOUNTER — Other Ambulatory Visit: Payer: Self-pay

## 2021-03-13 ENCOUNTER — Encounter: Payer: Medicare Other | Admitting: Plastic Surgery

## 2021-03-13 ENCOUNTER — Ambulatory Visit (INDEPENDENT_AMBULATORY_CARE_PROVIDER_SITE_OTHER): Payer: Medicare Other | Admitting: Plastic Surgery

## 2021-03-13 ENCOUNTER — Encounter: Payer: Self-pay | Admitting: Plastic Surgery

## 2021-03-13 DIAGNOSIS — S81801A Unspecified open wound, right lower leg, initial encounter: Secondary | ICD-10-CM

## 2021-03-13 NOTE — Progress Notes (Signed)
The patient is a 65 year old male here with his girlfriend for evaluation of his right leg.  The dog bite and underwent wound care and then skin graft.  The VAC has been in place.  I was able to remove that today.  He appears to have very nice take of his skin.  We will use a Xeroform dressing and continue with the splint for at least another week.  I would like him to change the Xeroform every other day starting on Sunday or Monday.  He knows not to get the area wet.  The donor site he should continue with KY dressings daily.  I would like to see him back and 10 days.  Patient is in agreement with this plan.  Pictures were obtained of the patient and placed in the chart with the patient's or guardian's permission.

## 2021-03-16 ENCOUNTER — Telehealth: Payer: Self-pay | Admitting: *Deleted

## 2021-03-16 NOTE — Telephone Encounter (Deleted)
Faxed order to [facility] for supplies for the patient.   Supplies:Roll Gauze (Kerlix-4.5 x 4.1)

## 2021-03-18 ENCOUNTER — Ambulatory Visit (INDEPENDENT_AMBULATORY_CARE_PROVIDER_SITE_OTHER): Payer: Medicare Other | Admitting: Primary Care

## 2021-03-18 NOTE — Telephone Encounter (Signed)
Faxed order to [facility] for supplies for the patient.   Supplies:Roll Gauze (Kerlix 4.5 x 4.1) Daily                Xeroform (5 x 9) Daily                Ace Wrap (4 inch) Daily   Received Order Status Notification Stating:[employer] has provided service for the patient;no further action is required.//AB/CMA

## 2021-03-24 ENCOUNTER — Ambulatory Visit: Payer: Medicare Other | Admitting: Plastic Surgery

## 2021-03-27 ENCOUNTER — Telehealth: Payer: Self-pay | Admitting: Plastic Surgery

## 2021-03-27 ENCOUNTER — Encounter: Payer: Medicare Other | Admitting: Surgical

## 2021-03-27 NOTE — Telephone Encounter (Signed)
Deana from [facility] called to advise that she has changed the outer bandage but not the inner bandage because she was told only the surgeon could do it. Patient missed his appointment. She said it does not currently look infected but she didn't think he could wait until next week to be addressed.She requested a call back to let her know we received. [phone]  Deana was called back to advise we received message and patient is supposed to come in this afternoon.

## 2021-04-03 ENCOUNTER — Ambulatory Visit (INDEPENDENT_AMBULATORY_CARE_PROVIDER_SITE_OTHER): Payer: Medicare Other | Admitting: Physician Assistant

## 2021-04-03 DIAGNOSIS — S81801A Unspecified open wound, right lower leg, initial encounter: Secondary | ICD-10-CM

## 2021-04-03 NOTE — Progress Notes (Signed)
No show

## 2021-05-25 ENCOUNTER — Ambulatory Visit: Payer: Medicare Other | Admitting: Nurse Practitioner

## 2021-10-09 ENCOUNTER — Inpatient Hospital Stay (HOSPITAL_COMMUNITY)
Admission: RE | Admit: 2021-10-09 | Discharge: 2021-10-09 | Disposition: A | Discharge status: Home / Self Care | Payer: Medicare Other | Source: Ambulatory Visit | Attending: Internal Medicine | Admitting: Internal Medicine

## 2021-10-09 DIAGNOSIS — I5022 Chronic systolic (congestive) heart failure: Secondary | ICD-10-CM
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
## 2021-11-16 ENCOUNTER — Other Ambulatory Visit: Payer: Self-pay | Admitting: Student

## 2021-11-16 DIAGNOSIS — K4090 Unilateral inguinal hernia, without obstruction or gangrene, not specified as recurrent: Secondary | ICD-10-CM

## 2021-12-08 ENCOUNTER — Inpatient Hospital Stay: Admission: RE | Admit: 2021-12-08 | Payer: Medicare Other | Source: Ambulatory Visit

## 2021-12-13 IMAGING — DX DG TIBIA/FIBULA 2V*R*
4 series · 4 of 4 positions shown · non-contrast
Comparison: None.

CLINICAL DATA: Patient was bit by a dog on the lateral right leg.

EXAM:
RIGHT TIBIA AND FIBULA - 2 VIEW

[tibia ap (1 of 2)]
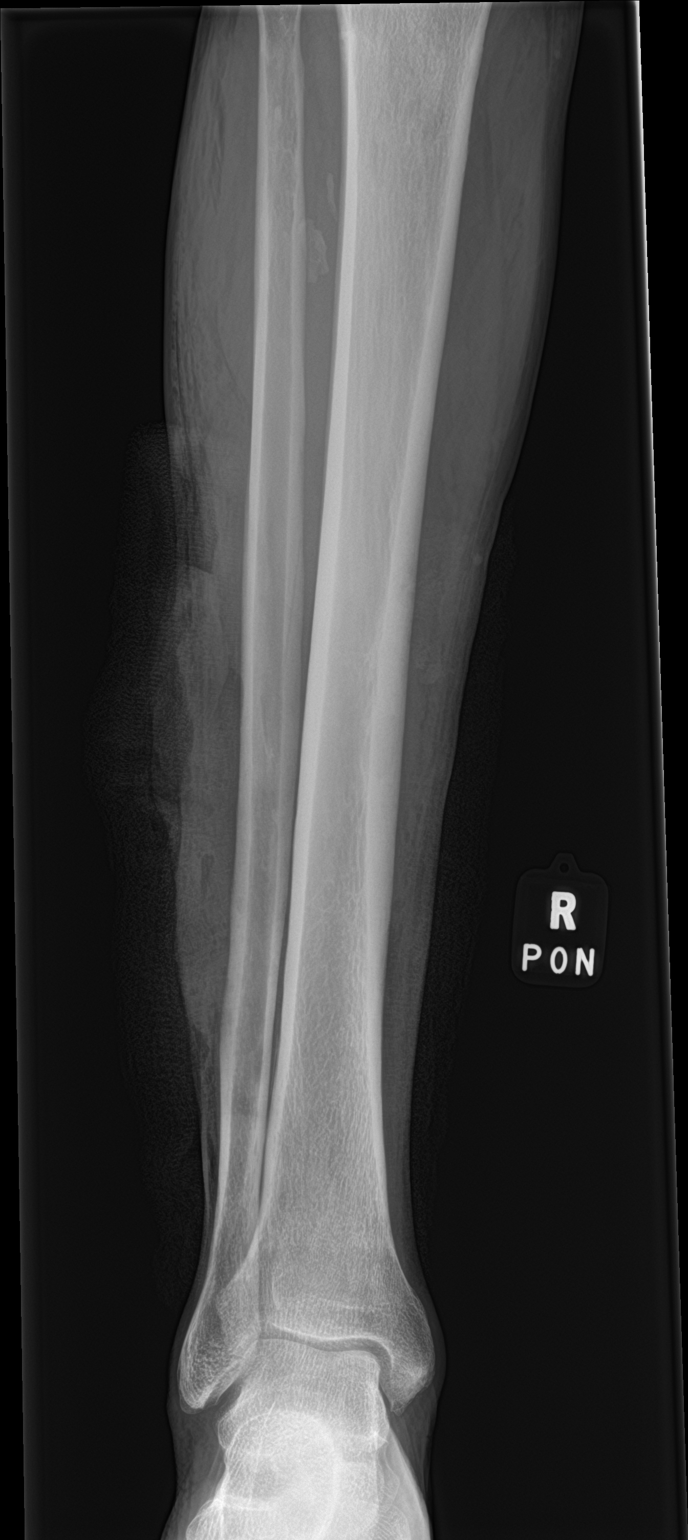

[tibia ap (2 of 2)]
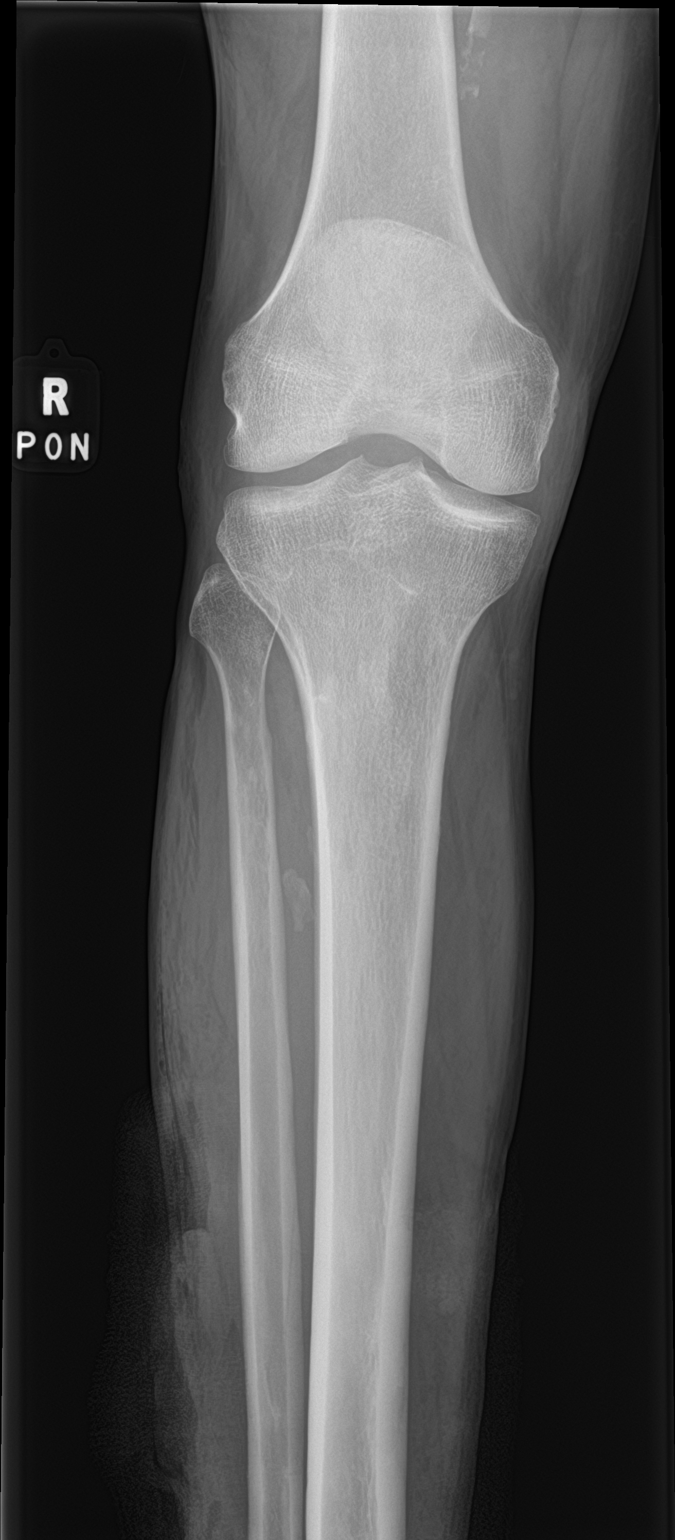

[tibia lat (1 of 2)]
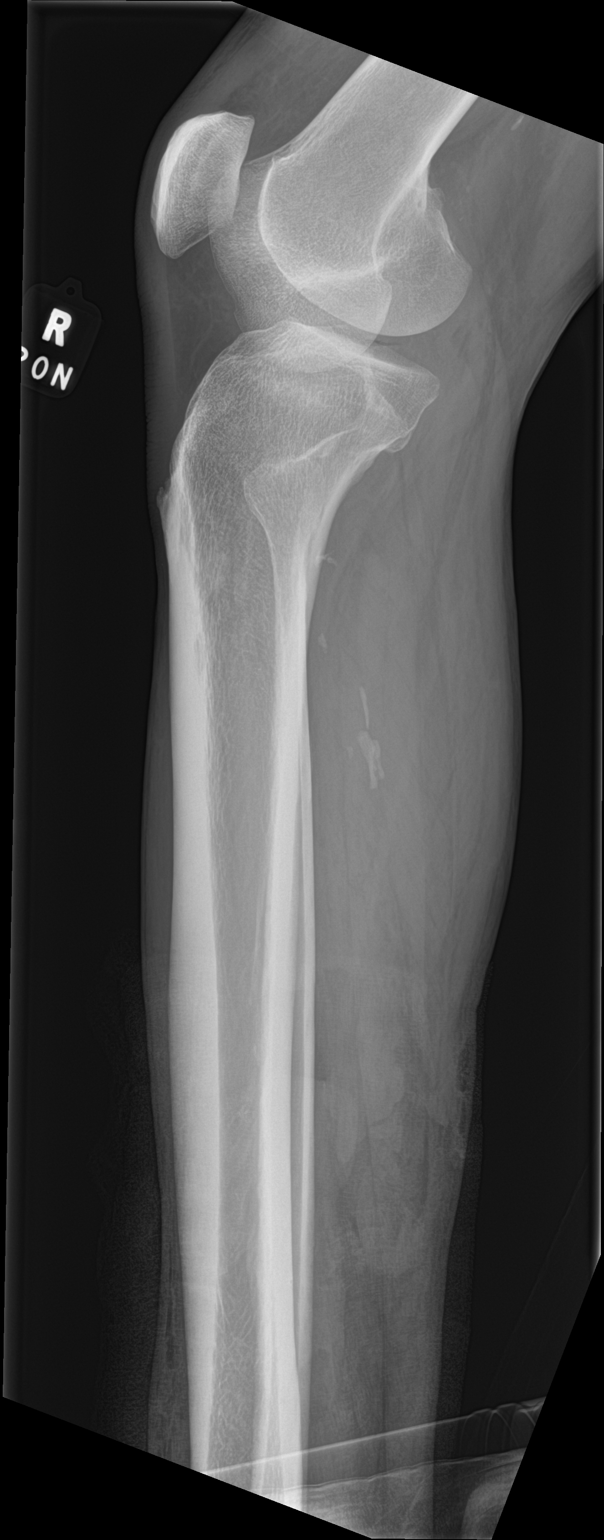

[tibia lat (2 of 2)]
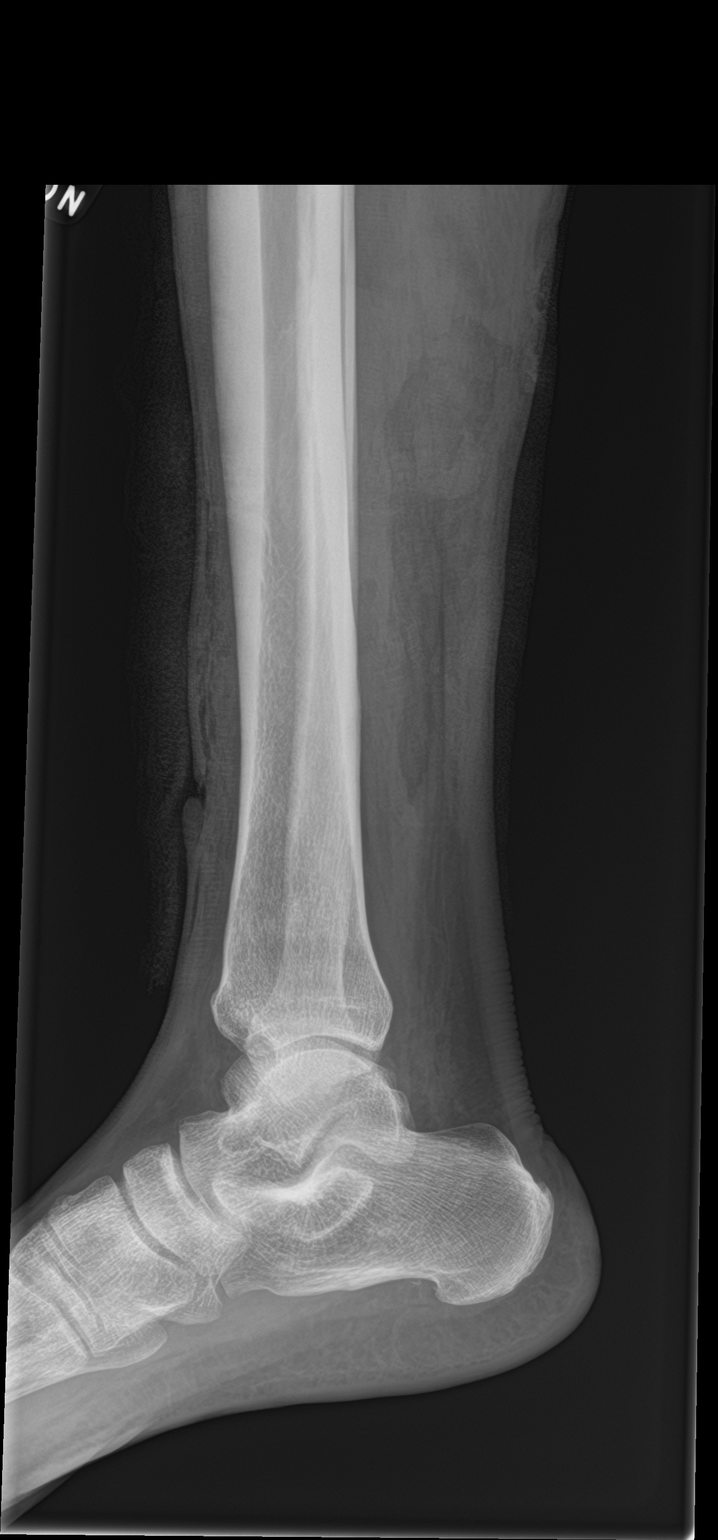

[4 of 4 positions shown; findings below may reference images not displayed]

FINDINGS: There is no evidence of fracture or other focal bone lesions. No
radiopaque foreign body is identified. There is extensive soft
tissue swelling, soft tissue gas, and skin lacerations involving the
lateral aspect of the leg.
IMPRESSION: No acute osseous injury or radiopaque foreign body.

## 2022-05-14 ENCOUNTER — Other Ambulatory Visit: Payer: Self-pay | Admitting: Student

## 2022-05-14 DIAGNOSIS — F17210 Nicotine dependence, cigarettes, uncomplicated: Secondary | ICD-10-CM

## 2023-07-18 DIAGNOSIS — E785 Hyperlipidemia, unspecified: Secondary | ICD-10-CM | POA: Diagnosis not present

## 2023-07-18 DIAGNOSIS — Z79899 Other long term (current) drug therapy: Secondary | ICD-10-CM | POA: Diagnosis not present

## 2023-07-18 DIAGNOSIS — Z23 Encounter for immunization: Secondary | ICD-10-CM | POA: Diagnosis not present

## 2023-07-18 DIAGNOSIS — F17210 Nicotine dependence, cigarettes, uncomplicated: Secondary | ICD-10-CM | POA: Diagnosis not present

## 2023-07-18 DIAGNOSIS — J439 Emphysema, unspecified: Secondary | ICD-10-CM | POA: Diagnosis not present

## 2023-07-18 DIAGNOSIS — H2589 Other age-related cataract: Secondary | ICD-10-CM | POA: Diagnosis not present

## 2023-07-18 DIAGNOSIS — Z136 Encounter for screening for cardiovascular disorders: Secondary | ICD-10-CM | POA: Diagnosis not present

## 2023-07-18 DIAGNOSIS — I5022 Chronic systolic (congestive) heart failure: Secondary | ICD-10-CM | POA: Diagnosis not present

## 2023-07-18 DIAGNOSIS — Z1159 Encounter for screening for other viral diseases: Secondary | ICD-10-CM | POA: Diagnosis not present

## 2023-07-18 DIAGNOSIS — Z0001 Encounter for general adult medical examination with abnormal findings: Secondary | ICD-10-CM | POA: Diagnosis not present

## 2023-07-18 DIAGNOSIS — I110 Hypertensive heart disease with heart failure: Secondary | ICD-10-CM | POA: Diagnosis not present

## 2023-08-19 DIAGNOSIS — R7303 Prediabetes: Secondary | ICD-10-CM | POA: Diagnosis not present

## 2023-08-19 DIAGNOSIS — J449 Chronic obstructive pulmonary disease, unspecified: Secondary | ICD-10-CM | POA: Diagnosis not present

## 2023-08-19 DIAGNOSIS — I5042 Chronic combined systolic (congestive) and diastolic (congestive) heart failure: Secondary | ICD-10-CM | POA: Diagnosis not present

## 2023-08-19 DIAGNOSIS — I110 Hypertensive heart disease with heart failure: Secondary | ICD-10-CM | POA: Diagnosis not present

## 2023-08-19 DIAGNOSIS — Z79899 Other long term (current) drug therapy: Secondary | ICD-10-CM | POA: Diagnosis not present

## 2023-11-09 DIAGNOSIS — H2589 Other age-related cataract: Secondary | ICD-10-CM | POA: Diagnosis not present

## 2023-11-09 DIAGNOSIS — J449 Chronic obstructive pulmonary disease, unspecified: Secondary | ICD-10-CM | POA: Diagnosis not present

## 2023-11-09 DIAGNOSIS — I110 Hypertensive heart disease with heart failure: Secondary | ICD-10-CM | POA: Diagnosis not present

## 2023-11-09 DIAGNOSIS — Z136 Encounter for screening for cardiovascular disorders: Secondary | ICD-10-CM | POA: Diagnosis not present

## 2023-11-09 DIAGNOSIS — R0683 Snoring: Secondary | ICD-10-CM | POA: Diagnosis not present

## 2023-11-09 DIAGNOSIS — J439 Emphysema, unspecified: Secondary | ICD-10-CM | POA: Diagnosis not present

## 2023-11-09 DIAGNOSIS — I472 Ventricular tachycardia, unspecified: Secondary | ICD-10-CM | POA: Diagnosis not present

## 2023-11-09 DIAGNOSIS — I5042 Chronic combined systolic (congestive) and diastolic (congestive) heart failure: Secondary | ICD-10-CM | POA: Diagnosis not present

## 2023-11-09 DIAGNOSIS — F17210 Nicotine dependence, cigarettes, uncomplicated: Secondary | ICD-10-CM | POA: Diagnosis not present

## 2023-11-09 DIAGNOSIS — Z79899 Other long term (current) drug therapy: Secondary | ICD-10-CM | POA: Diagnosis not present

## 2023-11-15 DIAGNOSIS — Z136 Encounter for screening for cardiovascular disorders: Secondary | ICD-10-CM | POA: Diagnosis not present

## 2023-11-15 DIAGNOSIS — Z79899 Other long term (current) drug therapy: Secondary | ICD-10-CM | POA: Diagnosis not present

## 2024-01-31 DIAGNOSIS — H2523 Age-related cataract, morgagnian type, bilateral: Secondary | ICD-10-CM | POA: Diagnosis not present

## 2024-01-31 DIAGNOSIS — H40022 Open angle with borderline findings, high risk, left eye: Secondary | ICD-10-CM | POA: Diagnosis not present

## 2024-03-15 DIAGNOSIS — H524 Presbyopia: Secondary | ICD-10-CM | POA: Diagnosis not present

## 2024-03-15 DIAGNOSIS — H40022 Open angle with borderline findings, high risk, left eye: Secondary | ICD-10-CM | POA: Diagnosis not present

## 2024-03-15 DIAGNOSIS — H2523 Age-related cataract, morgagnian type, bilateral: Secondary | ICD-10-CM | POA: Diagnosis not present

## 2024-04-25 DIAGNOSIS — Z79899 Other long term (current) drug therapy: Secondary | ICD-10-CM | POA: Diagnosis not present

## 2024-04-25 DIAGNOSIS — E785 Hyperlipidemia, unspecified: Secondary | ICD-10-CM | POA: Diagnosis not present

## 2024-04-25 DIAGNOSIS — Z0001 Encounter for general adult medical examination with abnormal findings: Secondary | ICD-10-CM | POA: Diagnosis not present

## 2024-04-25 DIAGNOSIS — Z23 Encounter for immunization: Secondary | ICD-10-CM | POA: Diagnosis not present

## 2024-04-25 DIAGNOSIS — F17210 Nicotine dependence, cigarettes, uncomplicated: Secondary | ICD-10-CM | POA: Diagnosis not present

## 2024-04-25 DIAGNOSIS — I5042 Chronic combined systolic (congestive) and diastolic (congestive) heart failure: Secondary | ICD-10-CM | POA: Diagnosis not present

## 2024-04-25 DIAGNOSIS — I110 Hypertensive heart disease with heart failure: Secondary | ICD-10-CM | POA: Diagnosis not present

## 2024-04-25 DIAGNOSIS — R7303 Prediabetes: Secondary | ICD-10-CM | POA: Diagnosis not present

## 2024-04-25 DIAGNOSIS — I70203 Unspecified atherosclerosis of native arteries of extremities, bilateral legs: Secondary | ICD-10-CM | POA: Diagnosis not present

## 2024-04-25 DIAGNOSIS — R0989 Other specified symptoms and signs involving the circulatory and respiratory systems: Secondary | ICD-10-CM | POA: Diagnosis not present

## 2024-04-25 DIAGNOSIS — H2589 Other age-related cataract: Secondary | ICD-10-CM | POA: Diagnosis not present

## 2024-05-21 ENCOUNTER — Ambulatory Visit: Admitting: Podiatry
